# Patient Record
Sex: Female | Born: 1975 | Race: Black or African American | Hispanic: No | Marital: Single | Smoking: Former smoker
Health system: Southern US, Community
[De-identification: ages and names within clinical notes are randomized; demographics above are authoritative.]

## PROBLEM LIST (undated history)

## (undated) DIAGNOSIS — E0590 Thyrotoxicosis, unspecified without thyrotoxic crisis or storm: Secondary | ICD-10-CM

## (undated) HISTORY — PX: THYROIDECTOMY: SHX17

## (undated) HISTORY — PX: NO PAST SURGERIES: SHX2092

---

## 1997-09-04 ENCOUNTER — Inpatient Hospital Stay (HOSPITAL_COMMUNITY): Admission: AD | Admit: 1997-09-04 | Discharge: 1997-09-06 | Payer: Self-pay | Admitting: *Deleted

## 1997-11-17 ENCOUNTER — Other Ambulatory Visit: Admission: RE | Admit: 1997-11-17 | Discharge: 1997-11-17 | Payer: Self-pay

## 1997-11-17 ENCOUNTER — Encounter: Admission: RE | Admit: 1997-11-17 | Discharge: 1997-11-17 | Payer: Self-pay | Admitting: Family Medicine

## 1997-11-24 ENCOUNTER — Encounter: Admission: RE | Admit: 1997-11-24 | Discharge: 1997-11-24 | Payer: Self-pay | Admitting: Family Medicine

## 2001-12-02 ENCOUNTER — Other Ambulatory Visit: Admission: RE | Admit: 2001-12-02 | Discharge: 2001-12-02 | Payer: Self-pay | Admitting: *Deleted

## 2003-01-12 ENCOUNTER — Other Ambulatory Visit: Admission: RE | Admit: 2003-01-12 | Discharge: 2003-01-12 | Payer: Self-pay | Admitting: Gynecology

## 2004-05-31 ENCOUNTER — Inpatient Hospital Stay (HOSPITAL_COMMUNITY): Admission: AD | Admit: 2004-05-31 | Discharge: 2004-05-31 | Payer: Self-pay | Admitting: *Deleted

## 2004-07-05 ENCOUNTER — Inpatient Hospital Stay (HOSPITAL_COMMUNITY): Admission: AD | Admit: 2004-07-05 | Discharge: 2004-07-05 | Payer: Self-pay | Admitting: Obstetrics and Gynecology

## 2004-11-20 ENCOUNTER — Ambulatory Visit: Payer: Self-pay | Admitting: Endocrinology

## 2004-12-12 ENCOUNTER — Ambulatory Visit: Payer: Self-pay | Admitting: Endocrinology

## 2005-01-03 ENCOUNTER — Inpatient Hospital Stay (HOSPITAL_COMMUNITY): Admission: AD | Admit: 2005-01-03 | Discharge: 2005-01-06 | Payer: Self-pay | Admitting: Obstetrics and Gynecology

## 2005-02-11 ENCOUNTER — Ambulatory Visit: Payer: Self-pay | Admitting: Endocrinology

## 2005-05-15 ENCOUNTER — Ambulatory Visit: Payer: Self-pay | Admitting: Endocrinology

## 2007-01-26 ENCOUNTER — Encounter: Payer: Self-pay | Admitting: *Deleted

## 2007-01-26 DIAGNOSIS — E0590 Thyrotoxicosis, unspecified without thyrotoxic crisis or storm: Secondary | ICD-10-CM | POA: Insufficient documentation

## 2008-09-19 ENCOUNTER — Telehealth (INDEPENDENT_AMBULATORY_CARE_PROVIDER_SITE_OTHER): Payer: Self-pay | Admitting: *Deleted

## 2008-09-22 ENCOUNTER — Ambulatory Visit: Payer: Self-pay | Admitting: Endocrinology

## 2008-09-27 ENCOUNTER — Encounter (INDEPENDENT_AMBULATORY_CARE_PROVIDER_SITE_OTHER): Payer: Self-pay | Admitting: *Deleted

## 2008-10-12 ENCOUNTER — Encounter (HOSPITAL_COMMUNITY): Admission: RE | Admit: 2008-10-12 | Discharge: 2009-01-10 | Payer: Self-pay | Admitting: Endocrinology

## 2008-11-03 ENCOUNTER — Ambulatory Visit (HOSPITAL_COMMUNITY): Admission: RE | Admit: 2008-11-03 | Discharge: 2008-11-03 | Payer: Self-pay | Admitting: Endocrinology

## 2010-08-05 LAB — HCG, SERUM, QUALITATIVE: Preg, Serum: NEGATIVE

## 2010-09-14 NOTE — Discharge Summary (Signed)
Mikayla Baker, Mikayla [name]:  [id number]   MEDICAL RECORD NO.:  [id number]          PATIENT TYPE:  INP   LOCATION:  9112                          FACILITY:  [facility]   PHYSICIAN:  Huel Cote, M.D. DATE OF BIRTH:  03/12/1976   DATE OF ADMISSION:  01/03/2005  DATE OF DISCHARGE:  01/06/2005                                 DISCHARGE SUMMARY   DISCHARGE DIAGNOSES:  1.  Term pregnancy at 39 plus weeks delivered  2.  Status post precipitous vaginal delivery  3.  Hyperthyroidism.   DISCHARGE MEDICATIONS:  The patient declined any pain medicines and will be  seeing Dr. Rennis Harding to determine the dosing of her PTU.   HOSPITAL COURSE:  The patient is a 35 year old gravida 2, para 1-0-0-1 at 35  plus weeks who was admitted in labor with cervical exam of 6 cm. Prenatal  care had been complicated by hyperthyroidism which was treated in PTU and  she is seen by Dr. Everardo All. Otherwise her prenatal care was uneventful.   PRENATAL LABS:  O+, antibody negative, RPR nonreactive, rubella immune,  hepatitis B surface antigen negative, HIV negative, GC negative, Chlamydia  negative, triple screen normal, 1-hour Glucola 114 and group B strep  negative.   PAST OBSTETRICAL HISTORY:  Significant for vaginal delivery  in 1999 of 7  pounds 12 ounces infant.   PAST MEDICAL HISTORY:  Hyperthyroidism.   PAST SURGICAL HISTORY:  None.   ALLERGIES:  No known drug allergies.   MEDICATIONS ON ADMISSION:  She is on PTU 100 milligrams p.o. twice daily.   HOSPITAL COURSE:  She is afebrile with stable vital signs. She had overall  reassuring fetal heart rate pattern on admission. Shortly after admission  she had rupture of membranes with moderate meconium noted and rapidly  progressed to complete dilation. She then pushed well and had a vaginal  delivery of a viable female infant, Apgars were 1 and 8. His weight was 6  pounds 1 ounce. Nuchal cord x2 which was reduced. Infant was bulb and DeLee  suctioned on perineum with moderate meconium noted. NICU team was present.  The baby did require some resuscitation and the cord pH was 7.19. Placenta  delivered spontaneously and intact. Small second-degree laceration was  repaired with 3-0 Vicryl with a local block. Estimated blood loss was less  than 500 mL. The baby did require early admission to the newborn nursery,  however, did quite well thereafter. She was admitted for routine postpartum  care. By postpartum day #2 she was doing very well. She is afebrile with  stable vital signs. Discharge hemoglobin was 11.4. Fundus was firm. Her  bleeding was normal. The baby was doing quite well and nursing well and was  able to be discharged home with mom.      Huel Cote, M.D.  Electronically Signed     KR/MEDQ  D:  01/06/2005  T:  01/06/2005  Job:  [id number]

## 2011-03-25 ENCOUNTER — Encounter (HOSPITAL_COMMUNITY): Payer: Self-pay | Admitting: *Deleted

## 2011-03-25 ENCOUNTER — Inpatient Hospital Stay (HOSPITAL_COMMUNITY)
Admission: AD | Admit: 2011-03-25 | Discharge: 2011-03-25 | Disposition: A | Discharge status: Home / Self Care | Payer: Medicaid Other | Source: Ambulatory Visit | Attending: Obstetrics & Gynecology | Admitting: Obstetrics & Gynecology

## 2011-03-25 ENCOUNTER — Inpatient Hospital Stay (HOSPITAL_COMMUNITY): Payer: Medicaid Other

## 2011-03-25 DIAGNOSIS — O209 Hemorrhage in early pregnancy, unspecified: Secondary | ICD-10-CM

## 2011-03-25 HISTORY — DX: Thyrotoxicosis, unspecified without thyrotoxic crisis or storm: E05.90

## 2011-03-25 LAB — WET PREP, GENITAL: Yeast Wet Prep HPF POC: NONE SEEN

## 2011-03-25 LAB — HCG, QUANTITATIVE, PREGNANCY: hCG, Beta Chain, Quant, S: 48244 m[IU]/mL — ABNORMAL HIGH (ref <5)

## 2011-03-25 LAB — CBC
HCT: 32.1 % — ABNORMAL LOW (ref 36.0–46.0)
Platelets: 214 10*3/uL (ref 150–400)
RDW: 11.8 % (ref 11.5–15.5)
WBC: 6.3 10*3/uL (ref 4.0–10.5)

## 2011-03-25 LAB — ABO/RH: ABO/RH(D): O POS

## 2011-03-25 NOTE — Progress Notes (Signed)
SSE done per CNM.  Wet prep and cultures collected.  VE done.  

## 2011-03-25 NOTE — Progress Notes (Signed)
M. Williams, CNM at bedside.  Assessment done and poc discussed with pt.   

## 2011-03-25 NOTE — Progress Notes (Signed)
PT SAYS SHE WENT TO [facility]RGENT [facility] [place]- ON Friday- SAID PREG-  GAVE PREG VERI LETTER . DID NOT GIVE EDC.   PLANS TO START PNC WITH  [facility].    NOW SAYS AT 0530- WENT TO B-ROOM- WHEN SHE WIPED  - SAW  PINKISH BLOOD ON TOILET PAPER..    ON BED- SAME- BUT LIGHTER.  SAYS NO PAD ON NOW- DOES NOT FEEL ANY  IN  CLOTHES..  NO CRAMPING NOW.     LAST SEX-  1 MTH AGO.

## 2011-03-25 NOTE — ED Provider Notes (Signed)
History    Bleeding in pregnancy  HPI This is a 35 y.o. at 8 weeks by dates who presents with C/o vaginal bleeding today. No cramping. Has an appointment for NOB next week.  No fever, nausea or vomiting.  OB History    Grav Para Term Preterm Abortions TAB SAB Ect Mult Living   3 2 2       2       Past Medical History  Diagnosis Date  . Hyperthyroidism     Past Surgical History  Procedure Date  . No past surgeries     History reviewed. No pertinent family history.  History  Substance Use Topics  . Smoking status: Never Smoker   . Smokeless tobacco: Not on file  . Alcohol Use: No    Allergies: No Known Allergies  No prescriptions prior to admission    ROS As above  Physical Exam   Blood pressure 119/69, pulse 91, temperature 98.8 F (37.1 C), temperature source Oral, resp. rate 20, height 5\' 8"  (1.727 m), weight 138 lb 6 oz (62.766 kg), last menstrual period 12/24/2010, unknown if currently breastfeeding.  Physical Exam  No apparent distress. Resp: Unlabored Abdomen soft and nontender Pelvic:  Vagina has small to moderate dark red blood in vault. No clots, No active bleeding from os              Cervix long and closed              Uterus 10+ weeks size             Adnexae nontender  [place]:  SIUP at 13,.0 weeks        FHR 167        No SCH or evidence of bleeding        Placenta Anterior, above os  MAU Course  Procedures  Assessment and Plan  A:  Viable SIUP      Bleeding in first trimester P:  D/C home      SAB precautions      Keep appt for NOB  [facility] 03/25/2011, 8:12 AM

## 2011-03-26 LAB — GC/CHLAMYDIA PROBE AMP, GENITAL
Chlamydia, DNA Probe: NEGATIVE
GC Probe Amp, Genital: NEGATIVE

## 2011-04-10 LAB — OB RESULTS CONSOLE ANTIBODY SCREEN: Antibody Screen: NEGATIVE

## 2011-04-10 LAB — OB RESULTS CONSOLE GC/CHLAMYDIA: Chlamydia: NEGATIVE

## 2011-04-10 LAB — OB RESULTS CONSOLE ABO/RH: RH Type: POSITIVE

## 2011-04-10 LAB — OB RESULTS CONSOLE HEPATITIS B SURFACE ANTIGEN: Hepatitis B Surface Ag: NEGATIVE

## 2011-04-30 NOTE — L&D Delivery Note (Signed)
Operative Delivery Note Pt had SROM with bloody fluid, FHT then had repetetive variable decels and she rapidly progressed to completely dilated.  She pushed well and brought the vtx to +3, but the FHT was staying down in the 90s, so I elected to proceed with vacuum extraction.  At 11:13 PM a viable female was delivered via Vaginal, Vacuum Investment banker, operational).  Presentation: vertex; Position: Occiput,, Posterior; Station: +3.  Verbal consent: obtained from patient.  Risks and benefits discussed in detail.  Risks include, but are not limited to the risks of anesthesia, bleeding, infection, damage to maternal tissues, fetal cephalhematoma.  There is also the risk of inability to effect vaginal delivery of the head, or shoulder dystocia that cannot be resolved by established maneuvers, leading to the need for emergency cesarean section.  APGARs and weight pending.   Placenta status: spontaneous, intact.   Cord:  with the following complications: None.  Anesthesia: Epidural  Instruments: mushroom cup Episiotomy: None Lacerations: 1st degree;Perineal Suture Repair: 3.0 chromic Est. Blood Loss (mL): 350  Mom to postpartum.  Baby to stay with mom.  Discussed circumcision procedure and risks, obtained consent. Caidynce Muzyka D 09/26/2011, 11:30 PM

## 2011-05-02 ENCOUNTER — Other Ambulatory Visit (HOSPITAL_COMMUNITY): Payer: Self-pay | Admitting: Obstetrics and Gynecology

## 2011-05-02 DIAGNOSIS — O358XX0 Maternal care for other (suspected) fetal abnormality and damage, not applicable or unspecified: Secondary | ICD-10-CM

## 2011-05-02 DIAGNOSIS — IMO0002 Reserved for concepts with insufficient information to code with codable children: Secondary | ICD-10-CM

## 2011-05-02 DIAGNOSIS — Z3689 Encounter for other specified antenatal screening: Secondary | ICD-10-CM

## 2011-05-06 ENCOUNTER — Encounter: Payer: Self-pay | Admitting: Endocrinology

## 2011-05-06 ENCOUNTER — Ambulatory Visit (INDEPENDENT_AMBULATORY_CARE_PROVIDER_SITE_OTHER): Payer: Medicaid Other | Admitting: Endocrinology

## 2011-05-06 ENCOUNTER — Other Ambulatory Visit (INDEPENDENT_AMBULATORY_CARE_PROVIDER_SITE_OTHER): Payer: Medicaid Other

## 2011-05-06 VITALS — BP 102/60 | HR 78 | Temp 98.6°F | Ht 69.0 in | Wt 140.4 lb

## 2011-05-06 DIAGNOSIS — E0590 Thyrotoxicosis, unspecified without thyrotoxic crisis or storm: Secondary | ICD-10-CM

## 2011-05-06 NOTE — Progress Notes (Signed)
  Subjective:    Patient ID: Mikayla Baker, female    DOB: 1975-06-11, 36 y.o.   MRN: [id number]  HPI pt was last seen here for hyperthyroidism during her 2006 pregnancy.   She had i-131 rx in 2010.  She has not ret for f/u until now.  She is now at [redacted] weeks gestation.   Past Medical History  Diagnosis Date  . Hyperthyroidism     Past Surgical History  Procedure Date  . No past surgeries     History   Social History  . Marital Status: Single    Spouse Name: N/A    Number of Children: N/A  . Years of Education: N/A   Occupational History  . Not on file.   Social History Main Topics  . Smoking status: Never Smoker   . Smokeless tobacco: Not on file  . Alcohol Use: No  . Drug Use: No  . Sexually Active:    Other Topics Concern  . Not on file   Social History Narrative  . No narrative on file    No current outpatient prescriptions on file prior to visit.    No Known Allergies  No family history on file.  BP 102/60  Pulse 78  Temp(Src) 98.6 F (37 C) (Oral)  Ht 5\' 9"  (1.753 m)  Wt 140 lb 6.4 oz (63.685 kg)  BMI 20.73 kg/m2  SpO2 98%  LMP 12/24/2010    Review of Systems Denies weight change    Objective:   Physical Exam VITAL SIGNS:  See vs page GENERAL: no distress head: no deformity eyes: no periorbital swelling, but there is slight bilat proptosis external nose and ears are normal mouth: no lesion seen Neck: a healed scar is present.  i do not appreciate a nodule in the thyroid or elsewhere in the neck     outside test results are reviewed: TSH=0.012 Lab Results  Component Value Date   TSH 0.44 05/06/2011      Assessment & Plan:  Hyperthyroidism, persistent after i-131 rx, but resolved now, perhaps due to pregnancy

## 2011-05-06 NOTE — Patient Instructions (Addendum)
blood tests are being requested for you today.  please call [phone] to hear your test results.  You will be prompted to enter the 9-digit "MRN" number that appears at the top left of this page, followed by #.  Then you will hear the message. Please come back for a follow-up appointment for 1 month.  Please make an appointment. (update: i left message on phone-tree:  No rx needed now.  Please come back for a follow-up appointment for 1 month.  Please make an appointment).

## 2011-05-08 ENCOUNTER — Other Ambulatory Visit: Payer: Self-pay | Admitting: Maternal and Fetal Medicine

## 2011-05-08 ENCOUNTER — Ambulatory Visit (HOSPITAL_COMMUNITY)
Admission: RE | Admit: 2011-05-08 | Discharge: 2011-05-08 | Disposition: A | Discharge status: Home / Self Care | Payer: Medicaid Other | Source: Ambulatory Visit | Attending: Obstetrics and Gynecology | Admitting: Obstetrics and Gynecology

## 2011-05-08 ENCOUNTER — Encounter (HOSPITAL_COMMUNITY): Payer: Self-pay

## 2011-05-08 DIAGNOSIS — Z1389 Encounter for screening for other disorder: Secondary | ICD-10-CM | POA: Insufficient documentation

## 2011-05-08 DIAGNOSIS — IMO0002 Reserved for concepts with insufficient information to code with codable children: Secondary | ICD-10-CM

## 2011-05-08 DIAGNOSIS — O358XX0 Maternal care for other (suspected) fetal abnormality and damage, not applicable or unspecified: Secondary | ICD-10-CM | POA: Insufficient documentation

## 2011-05-08 DIAGNOSIS — Z3689 Encounter for other specified antenatal screening: Secondary | ICD-10-CM

## 2011-05-08 DIAGNOSIS — Z363 Encounter for antenatal screening for malformations: Secondary | ICD-10-CM | POA: Insufficient documentation

## 2011-05-08 NOTE — Progress Notes (Signed)
Genetic Counseling  High-Risk Gestation Note  Appointment Date:  05/08/2011 Referred By: Mikayla Belling, MD Date of Birth:  1975-12-11 Partner:  Mikayla Baker   Pregnancy History: [id number] Estimated Date of Delivery: 09/30/11 Estimated Gestational Age: [redacted]w[redacted]d  I met with [name] and her partner, Mr. Mikayla Baker, for genetic counseling regarding abnormal ultrasound findings by outside ultrasound.  A detailed fetal ultrasound was performed today.  The report will be sent under separate cover.  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  The amount of amniotic fluid was wnl for gestational age.  No other anomalies were visualized today.    This couple was counseled that clubfoot/feet is a term that actually describes three distinct anomalies (talipes equinovarus, talipes calcaneovalgus, and metatarsus varus) and occurs in 1 in 1000 births.  The most common type of clubfeet, talipes equinovarus, is characterized by forefoot adduction with supination, heel varus, and ankle equines, which cannot be brought back to a neutral position.  They were counseled that clubfeet can be an isolated difference, occur as a feature of an underlying syndrome, or the result of neurological or impairment.  We discussed that the most likely mode of inheritance for nonsyndromic, isolated clubfoot/feet is multifactorial.  There is a known genetic component for multifactorial clubfoot, as demonstrated by twin studies; environmental factors also play a role, including infection, drugs, and intrauterine environment (oligohydramnios, fetal positioning).  While the majority of cases of clubfoot/feet are isolated and multifactorial in etiology, it can also be environmental or genetic in etiology.  Clubfoot/feet is a feature in more than 200 known genetic syndromes, including both chromosomal and single gene conditions involving both neurologic and muscular processes.   They were then  counseled regarding the finding of an EIF.  We discussed that an EIF represents a calcification in the ventricle of the fetal heart.  In patients who have a low risk for fetal aneuploidy, this finding is typically considered to be benign and not associated with an increased risk for fetal aneuploidy.  However, when other risk factors are present, including: AMA, abnormal maternal serum screen results, ultrasound observation of other soft markers/structural anomalies, this finding is associated with an increased risk for fetal aneuploidy, specifically Down syndrome.  Considering Mikayla Baker's Quad screen adjusted risk for fetal Down syndrome (1 in 373) and the ultrasound findings, we discussed that the risk for aneuploidy is estimated to be 1-2%.    We discussed the option of amniocentesis, including the limitations, benefits, and risks.  They understand that amniocentesis allows for evaluation of the fetal chromosomes, but cannot detect all genetic conditions.  Specifically, we discussed that single gene conditions are difficult to diagnose prenatally unless a specific condition is suspected based on additional ultrasound findings or family history.  In addition, we discussed another type of screening test, noninvasive prenatal testing (NIPT), which utilizes cell free fetal DNA found in the maternal circulation. This test is not diagnostic for chromosome conditions, but can provide information regarding the presence or absence of extra fetal DNA for chromosomes 13, 18 and 21. Thus, it would not identify or rule out Turner syndrome. The reported detection rate is greater than 99% for Trisomy 21, greater than 97% for Trisomy 18, and is approximately 91% for Trisomy 13. The false positive rate is thought to be less than 1% for any of these conditions. After consideration of all the options, and a clear understanding of the newness and limitations of NIPT, they elected to proceed  with cell free fetal DNA testing. Those  results will be available in 8-10 days and will be forwarded to your office when we receive them. [name] will consider the option of amniocentesis pending the results of this screening test.   They were counseled that it is difficult to provide accurate anticipatory guidance or a discussion of postnatal management without clearly understanding the cause of the clubfeet.  However, they understand that regardless of the physiologic process that led to decreased movement in utero, it was the lack of fetal movement which resulted in the finding of clubfeet.  We discussed that postnatally, the contracted joint must be mobilized as much as possible during the first few months of life, as this is the time when the joint is most responsive.  In most cases, a combination of orthopedic procedures and physical therapy are used to correct the contracted joint.  They understand that a specific treatment plan will be determined after birth by an orthopedic specialist.  We discussed the option of meeting with a pediatric orthopedic specialist to discuss expectant management and treatment of clubfeet.  They would like to pursue treatment for their child at [facility]. This couple declined the option of a prenatal consult with pediatric orthopedics.  Both family histories were reviewed and found to be noncontributory for birth defects, mental retardation, and known genetic conditions.  Without further information regarding the provided family history, an accurate genetic risk cannot be calculated.   Further genetic counseling is warranted if more information is obtained.  The patient denied exposure to environmental toxins or chemical agents.  She denied the use of alcohol, tobacco or street drugs.  She denied significant viral illnesses during the course of her pregnancy.  Her medical and surgical history were noncontributory.   I counseled the patient for approximately 55 minutes regarding  the above risks and available options.    Mikayla Prose, MS Certified Genetic Counselor 05/08/11

## 2011-05-08 NOTE — Progress Notes (Signed)
Obstetric ultrasound performed today.  Please see report in [facility]. 

## 2011-05-20 ENCOUNTER — Other Ambulatory Visit: Payer: Self-pay

## 2011-06-05 ENCOUNTER — Ambulatory Visit (INDEPENDENT_AMBULATORY_CARE_PROVIDER_SITE_OTHER): Payer: Medicaid Other | Admitting: Endocrinology

## 2011-06-05 ENCOUNTER — Encounter: Payer: Self-pay | Admitting: Endocrinology

## 2011-06-05 ENCOUNTER — Other Ambulatory Visit (INDEPENDENT_AMBULATORY_CARE_PROVIDER_SITE_OTHER): Payer: Medicaid Other

## 2011-06-05 ENCOUNTER — Ambulatory Visit (HOSPITAL_COMMUNITY)
Admission: RE | Admit: 2011-06-05 | Discharge: 2011-06-05 | Disposition: A | Discharge status: Home / Self Care | Payer: Medicaid Other | Source: Ambulatory Visit | Attending: Obstetrics and Gynecology | Admitting: Obstetrics and Gynecology

## 2011-06-05 VITALS — BP 124/62 | HR 103 | Temp 98.4°F | Ht 69.0 in | Wt 144.0 lb

## 2011-06-05 DIAGNOSIS — O358XX0 Maternal care for other (suspected) fetal abnormality and damage, not applicable or unspecified: Secondary | ICD-10-CM

## 2011-06-05 DIAGNOSIS — E0590 Thyrotoxicosis, unspecified without thyrotoxic crisis or storm: Secondary | ICD-10-CM

## 2011-06-05 DIAGNOSIS — IMO0002 Reserved for concepts with insufficient information to code with codable children: Secondary | ICD-10-CM

## 2011-06-05 LAB — T4, FREE: Free T4: 0.72 ng/dL (ref 0.60–1.60)

## 2011-06-05 NOTE — Patient Instructions (Addendum)
blood tests are being requested for you today.  please call [phone] to hear your test results.  You will be prompted to enter the 9-digit "MRN" number that appears at the top left of this page, followed by #.  Then you will hear the message. Please come back for a follow-up appointment in 6 weeks (update: i left message on phone-tree:  rx as we discussed) 

## 2011-06-05 NOTE — Progress Notes (Signed)
  Subjective:    Patient ID: Mikayla Baker, female    DOB: 10-31-1975, 36 y.o.   MRN: [id number]  HPI pt was seen here for hyperthyroidism during her 2006 pregnancy.   She had i-131 rx for hyperthyroidism in 2010.  She is now at [redacted] weeks gestation.  She has gained only 4-5 lbs so far.   Past Medical History  Diagnosis Date  . Hyperthyroidism     Past Surgical History  Procedure Date  . No past surgeries     History   Social History  . Marital Status: Single    Spouse Name: N/A    Number of Children: N/A  . Years of Education: N/A   Occupational History  . Not on file.   Social History Main Topics  . Smoking status: Never Smoker   . Smokeless tobacco: Not on file  . Alcohol Use: No  . Drug Use: No  . Sexually Active:    Other Topics Concern  . Not on file   Social History Narrative  . No narrative on file    No current outpatient prescriptions on file prior to visit.    No Known Allergies  No family history on file.  BP 124/62  Pulse 103  Temp(Src) 98.4 F (36.9 C) (Oral)  Ht 5\' 9"  (1.753 m)  Wt 144 lb (65.318 kg)  BMI 21.27 kg/m2  SpO2 98%  LMP 12/24/2010  Review of Systems Denies fever.      Objective:   Physical Exam VITAL SIGNS:  See vs page.   GENERAL: no distress.   Neck:  The left thyroid lobe is slightly enlarged, with a nodular surface.  The right lobe is normal  Lab Results  Component Value Date   TSH 0.39 06/05/2011      Assessment & Plan:  H/o hyperthyroidism, now euthyroid off any meds.  Euthyroidism usually does not persist (patients develop hypo- or hyperthyroidism)

## 2011-07-03 ENCOUNTER — Ambulatory Visit (HOSPITAL_COMMUNITY)
Admission: RE | Admit: 2011-07-03 | Discharge: 2011-07-03 | Disposition: A | Discharge status: Home / Self Care | Payer: Medicaid Other | Source: Ambulatory Visit | Attending: Obstetrics and Gynecology | Admitting: Obstetrics and Gynecology

## 2011-07-03 DIAGNOSIS — IMO0002 Reserved for concepts with insufficient information to code with codable children: Secondary | ICD-10-CM

## 2011-07-03 DIAGNOSIS — O358XX0 Maternal care for other (suspected) fetal abnormality and damage, not applicable or unspecified: Secondary | ICD-10-CM

## 2011-07-03 DIAGNOSIS — Z3689 Encounter for other specified antenatal screening: Secondary | ICD-10-CM | POA: Insufficient documentation

## 2011-07-03 DIAGNOSIS — E0590 Thyrotoxicosis, unspecified without thyrotoxic crisis or storm: Secondary | ICD-10-CM

## 2011-07-17 ENCOUNTER — Ambulatory Visit: Payer: Medicaid Other | Admitting: Endocrinology

## 2011-07-17 DIAGNOSIS — Z0289 Encounter for other administrative examinations: Secondary | ICD-10-CM

## 2011-07-25 ENCOUNTER — Encounter: Payer: Self-pay | Admitting: Obstetrics and Gynecology

## 2011-08-12 ENCOUNTER — Other Ambulatory Visit (HOSPITAL_COMMUNITY): Payer: Self-pay | Admitting: Obstetrics and Gynecology

## 2011-08-12 DIAGNOSIS — O358XX0 Maternal care for other (suspected) fetal abnormality and damage, not applicable or unspecified: Secondary | ICD-10-CM

## 2011-08-14 ENCOUNTER — Ambulatory Visit (HOSPITAL_COMMUNITY)
Admission: RE | Admit: 2011-08-14 | Discharge: 2011-08-14 | Disposition: A | Discharge status: Home / Self Care | Payer: Medicaid Other | Source: Ambulatory Visit | Attending: Obstetrics and Gynecology | Admitting: Obstetrics and Gynecology

## 2011-08-14 DIAGNOSIS — O358XX0 Maternal care for other (suspected) fetal abnormality and damage, not applicable or unspecified: Secondary | ICD-10-CM | POA: Insufficient documentation

## 2011-08-14 DIAGNOSIS — Z3689 Encounter for other specified antenatal screening: Secondary | ICD-10-CM | POA: Insufficient documentation

## 2011-09-26 ENCOUNTER — Inpatient Hospital Stay (HOSPITAL_COMMUNITY)
Admission: AD | Admit: 2011-09-26 | Discharge: 2011-09-28 | DRG: 373 | Disposition: A | Discharge status: Home / Self Care | Payer: BC Managed Care – PPO | Source: Ambulatory Visit | Attending: Obstetrics and Gynecology | Admitting: Obstetrics and Gynecology

## 2011-09-26 ENCOUNTER — Encounter (HOSPITAL_COMMUNITY): Payer: Self-pay | Admitting: *Deleted

## 2011-09-26 ENCOUNTER — Encounter (HOSPITAL_COMMUNITY): Payer: Self-pay | Admitting: Anesthesiology

## 2011-09-26 ENCOUNTER — Inpatient Hospital Stay (HOSPITAL_COMMUNITY): Payer: BC Managed Care – PPO | Admitting: Anesthesiology

## 2011-09-26 ENCOUNTER — Encounter (HOSPITAL_COMMUNITY): Payer: Self-pay

## 2011-09-26 ENCOUNTER — Telehealth (HOSPITAL_COMMUNITY): Payer: Self-pay | Admitting: *Deleted

## 2011-09-26 DIAGNOSIS — O99284 Endocrine, nutritional and metabolic diseases complicating childbirth: Secondary | ICD-10-CM | POA: Diagnosis present

## 2011-09-26 DIAGNOSIS — E0590 Thyrotoxicosis, unspecified without thyrotoxic crisis or storm: Secondary | ICD-10-CM | POA: Diagnosis present

## 2011-09-26 DIAGNOSIS — Z22330 Carrier of Group B streptococcus: Secondary | ICD-10-CM

## 2011-09-26 DIAGNOSIS — O99892 Other specified diseases and conditions complicating childbirth: Secondary | ICD-10-CM | POA: Diagnosis present

## 2011-09-26 DIAGNOSIS — O358XX0 Maternal care for other (suspected) fetal abnormality and damage, not applicable or unspecified: Secondary | ICD-10-CM | POA: Diagnosis present

## 2011-09-26 DIAGNOSIS — E079 Disorder of thyroid, unspecified: Secondary | ICD-10-CM | POA: Diagnosis present

## 2011-09-26 DIAGNOSIS — IMO0001 Reserved for inherently not codable concepts without codable children: Secondary | ICD-10-CM

## 2011-09-26 LAB — CBC
HCT: 32.3 % — ABNORMAL LOW (ref 36.0–46.0)
Hemoglobin: 10.9 g/dL — ABNORMAL LOW (ref 12.0–15.0)
MCH: 28.7 pg (ref 26.0–34.0)
MCV: 85 fL (ref 78.0–100.0)
Platelets: 272 10*3/uL (ref 150–400)
RBC: 3.8 MIL/uL — ABNORMAL LOW (ref 3.87–5.11)

## 2011-09-26 MED ORDER — OXYCODONE-ACETAMINOPHEN 5-325 MG PO TABS: 1.0000 | ORAL_TABLET | ORAL | Status: DC | PRN

## 2011-09-26 MED ORDER — EPHEDRINE 5 MG/ML INJ
10.0000 mg | INTRAVENOUS | Status: DC | PRN
  Filled 2011-09-26: qty 4

## 2011-09-26 MED ORDER — LIDOCAINE HCL (PF) 1 % IJ SOLN
30.0000 mL | INTRAMUSCULAR | Status: DC | PRN
  Filled 2011-09-26: qty 30

## 2011-09-26 MED ORDER — LACTATED RINGERS IV SOLN
INTRAVENOUS | Status: DC
  Administered 2011-09-26: 125 mL/h via INTRAVENOUS

## 2011-09-26 MED ORDER — CITRIC ACID-SODIUM CITRATE 334-500 MG/5ML PO SOLN: 30.0000 mL | ORAL | Status: DC | PRN

## 2011-09-26 MED ORDER — IBUPROFEN 600 MG PO TABS: 600.0000 mg | ORAL_TABLET | Freq: Four times a day (QID) | ORAL | Status: DC | PRN

## 2011-09-26 MED ORDER — OXYTOCIN 20 UNITS IN LACTATED RINGERS INFUSION - SIMPLE: 125.0000 mL/h | Freq: Once | INTRAVENOUS | Status: DC

## 2011-09-26 MED ORDER — ONDANSETRON HCL 4 MG/2ML IJ SOLN: 4.0000 mg | Freq: Four times a day (QID) | INTRAMUSCULAR | Status: DC | PRN

## 2011-09-26 MED ORDER — DIPHENHYDRAMINE HCL 50 MG/ML IJ SOLN: 12.5000 mg | INTRAMUSCULAR | Status: DC | PRN

## 2011-09-26 MED ORDER — OXYTOCIN BOLUS FROM INFUSION
500.0000 mL | Freq: Once | INTRAVENOUS | Status: DC
  Filled 2011-09-26: qty 500
  Filled 2011-09-26: qty 1000

## 2011-09-26 MED ORDER — PHENYLEPHRINE 40 MCG/ML (10ML) SYRINGE FOR IV PUSH (FOR BLOOD PRESSURE SUPPORT)
80.0000 ug | PREFILLED_SYRINGE | INTRAVENOUS | Status: DC | PRN
  Filled 2011-09-26: qty 5

## 2011-09-26 MED ORDER — LACTATED RINGERS IV SOLN: 500.0000 mL | Freq: Once | INTRAVENOUS | Status: DC

## 2011-09-26 MED ORDER — LACTATED RINGERS IV SOLN: 500.0000 mL | INTRAVENOUS | Status: DC | PRN

## 2011-09-26 MED ORDER — PHENYLEPHRINE 40 MCG/ML (10ML) SYRINGE FOR IV PUSH (FOR BLOOD PRESSURE SUPPORT): 80.0000 ug | PREFILLED_SYRINGE | INTRAVENOUS | Status: DC | PRN

## 2011-09-26 MED ORDER — LIDOCAINE HCL (PF) 1 % IJ SOLN
INTRAMUSCULAR | Status: DC | PRN
  Administered 2011-09-26 (×3): 4 mL

## 2011-09-26 MED ORDER — EPHEDRINE 5 MG/ML INJ: 10.0000 mg | INTRAVENOUS | Status: DC | PRN

## 2011-09-26 MED ORDER — FENTANYL 2.5 MCG/ML BUPIVACAINE 1/10 % EPIDURAL INFUSION (WH - ANES)
14.0000 mL/h | INTRAMUSCULAR | Status: DC
  Administered 2011-09-26: 14 mL/h via EPIDURAL
  Filled 2011-09-26: qty 60

## 2011-09-26 MED ORDER — SODIUM CHLORIDE 0.9 % IV SOLN
1.0000 g | Freq: Four times a day (QID) | INTRAVENOUS | Status: DC
  Filled 2011-09-26 (×3): qty 1000

## 2011-09-26 MED ORDER — SODIUM CHLORIDE 0.9 % IV SOLN
2.0000 g | Freq: Once | INTRAVENOUS | Status: AC
  Administered 2011-09-26: 2 g via INTRAVENOUS
  Filled 2011-09-26: qty 2000

## 2011-09-26 MED ORDER — ACETAMINOPHEN 325 MG PO TABS: 650.0000 mg | ORAL_TABLET | ORAL | Status: DC | PRN

## 2011-09-26 NOTE — Progress Notes (Signed)
Dr Jackelyn Knife notified of patient, tracing, ctx pattern and sve result. He will enter admission orders.

## 2011-09-26 NOTE — Anesthesia Procedure Notes (Signed)
Epidural Patient location during procedure: OB Start time: 09/26/2011 8:23 PM Reason for block: procedure for pain  Staffing Performed by: anesthesiologist   Preanesthetic Checklist Completed: patient identified, site marked, surgical consent, pre-op evaluation, timeout performed, IV checked, risks and benefits discussed and monitors and equipment checked  Epidural Patient position: sitting Prep: site prepped and draped and DuraPrep Patient monitoring: continuous pulse ox and blood pressure Approach: midline Injection technique: LOR air  Needle:  Needle type: Tuohy  Needle gauge: 17 G Needle length: 9 cm Needle insertion depth: 4 cm Catheter type: closed end flexible Catheter size: 19 Gauge Catheter at skin depth: 9 cm Test dose: negative  Assessment Events: blood not aspirated, injection not painful, no injection resistance, negative IV test and no paresthesia  Additional Notes Discussed risk of headache, infection, bleeding, nerve injury and failed or incomplete block.  Patient voices understanding and wishes to proceed.

## 2011-09-26 NOTE — Anesthesia Preprocedure Evaluation (Signed)
Anesthesia Evaluation  Patient identified by MRN, date of birth, ID band Patient awake    Reviewed: Allergy & Precautions, H&P , NPO status , Patient's Chart, lab work & pertinent test results, reviewed documented beta blocker date and time   History of Anesthesia Complications Negative for: history of anesthetic complications  Airway Mallampati: III TM Distance: >3 FB Neck ROM: full    Dental  (+) Teeth Intact   Pulmonary neg pulmonary ROS,  breath sounds clear to auscultation        Cardiovascular negative cardio ROS  Rhythm:regular Rate:Normal     Neuro/Psych negative neurological ROS  negative psych ROS   GI/Hepatic negative GI ROS, Neg liver ROS,   Endo/Other  Hyperthyroidism (now s/p thyroidectomy)   Renal/GU negative Renal ROS     Musculoskeletal   Abdominal   Peds  Hematology negative hematology ROS (+)   Anesthesia Other Findings   Reproductive/Obstetrics (+) Pregnancy                           Anesthesia Physical Anesthesia Plan  ASA: II  Anesthesia Plan: Epidural   Post-op Pain Management:    Induction:   Airway Management Planned:   Additional Equipment:   Intra-op Plan:   Post-operative Plan:   Informed Consent: I have reviewed the patients History and Physical, chart, labs and discussed the procedure including the risks, benefits and alternatives for the proposed anesthesia with the patient or authorized representative who has indicated his/her understanding and acceptance.     Plan Discussed with:   Anesthesia Plan Comments:         Anesthesia Quick Evaluation

## 2011-09-26 NOTE — H&P (Signed)
Mikayla Baker is a 36 y.o. female presenting for evaluation of regular ctx.  She was scheduled for induction tomorrow am.  On evaluation in MAU, VE 5-6 cm dilated.  She has been admitted and received an epidural.  Prenatal care complicated by bilateral club feet and EIF on u/s, nl fetal ECHO.  Followed by Dr. Everardo All for hyperthyroidism.  See prenatal records for complete history.  Maternal Medical History:  Reason for admission: Reason for admission: contractions.  Contractions: Frequency: regular.   Perceived severity is strong.    Fetal activity: Perceived fetal activity is normal.      OB History    Grav Para Term Preterm Abortions TAB SAB Ect Mult Living   3 2 2  0 0 0 0 0 0 2    SVD at term x 2, 6 lbs 1 oz and 7 lbs 12 oz, no comps  Past Medical History  Diagnosis Date  . Hyperthyroidism    Past Surgical History  Procedure Date  . No past surgeries   . Thyroidectomy 2006   Family History: family history includes Diabetes in her maternal grandmother and Hypertension in her maternal grandmother. Social History:  reports that she has never smoked. She does not have any smokeless tobacco history on file. She reports that she does not drink alcohol or use illicit drugs.  Review of Systems  Respiratory: Negative.   Cardiovascular: Negative.     Dilation: 7 Effacement (%): 90 Station: 0 Exam by:: LCarpenter,RN Blood pressure 131/87, pulse 100, temperature 98.6 F (37 C), temperature source Oral, resp. rate 18, height 5\' 9"  (1.753 m), weight 72.576 kg (160 lb), last menstrual period 12/24/2010, SpO2 100.00%. Maternal Exam:  Uterine Assessment: Contraction strength is firm.  Contraction frequency is regular.   Abdomen: Patient reports no abdominal tenderness. Estimated fetal weight is 7 lbs.   Fetal presentation: vertex  Introitus: Normal vulva. Normal vagina.  Amniotic fluid character: not assessed.  Pelvis: adequate for delivery.   Cervix: Cervix evaluated by digital  exam.     Fetal Exam Fetal Monitor Review: Mode: ultrasound.   Baseline rate: 140.  Variability: moderate (6-25 bpm).   Pattern: accelerations present and no decelerations.    Fetal State Assessment: Category I - tracings are normal.     Physical Exam  Constitutional: She appears well-developed and well-nourished.  Cardiovascular: Normal rate, regular rhythm and normal heart sounds.   No murmur heard. Respiratory: Effort normal and breath sounds normal. No respiratory distress. She has no wheezes.  GI: Soft.       Gravid     Prenatal labs: ABO, Rh: O/Positive/-- (12/12 0000) Antibody: Negative (12/12 0000) Rubella: Immune (12/12 0000) RPR: Nonreactive (12/12 0000)  HBsAg: Negative (12/12 0000)  HIV: Non-reactive (12/12 0000)  GBS: positive  Assessment/Plan: IUP at 39 weeks in active labor.  GBS positive, has received Ampicillin.  She declines any intervention currently, wants to deliver after midnight.  Will monitor progress, anticipate SVD.   [name] D 09/26/2011, 10:14 PM

## 2011-09-26 NOTE — Telephone Encounter (Signed)
Preadmission screen  

## 2011-09-27 ENCOUNTER — Inpatient Hospital Stay (HOSPITAL_COMMUNITY): Admission: RE | Admit: 2011-09-27 | Payer: Medicaid Other | Source: Ambulatory Visit

## 2011-09-27 MED ORDER — BENZOCAINE-MENTHOL 20-0.5 % EX AERO
1.0000 "application " | INHALATION_SPRAY | CUTANEOUS | Status: DC | PRN
  Administered 2011-09-27: 1 via TOPICAL
  Filled 2011-09-27 (×2): qty 56

## 2011-09-27 MED ORDER — SIMETHICONE 80 MG PO CHEW: 80.0000 mg | CHEWABLE_TABLET | ORAL | Status: DC | PRN

## 2011-09-27 MED ORDER — SENNOSIDES-DOCUSATE SODIUM 8.6-50 MG PO TABS
2.0000 | ORAL_TABLET | Freq: Every day | ORAL | Status: DC
  Administered 2011-09-27: 2 via ORAL

## 2011-09-27 MED ORDER — MEASLES, MUMPS & RUBELLA VAC ~~LOC~~ INJ
0.5000 mL | INJECTION | Freq: Once | SUBCUTANEOUS | Status: DC
  Filled 2011-09-27: qty 0.5

## 2011-09-27 MED ORDER — METHYLERGONOVINE MALEATE 0.2 MG/ML IJ SOLN: 0.2000 mg | INTRAMUSCULAR | Status: DC | PRN

## 2011-09-27 MED ORDER — WITCH HAZEL-GLYCERIN EX PADS: 1.0000 "application " | MEDICATED_PAD | CUTANEOUS | Status: DC | PRN

## 2011-09-27 MED ORDER — MAGNESIUM HYDROXIDE 400 MG/5ML PO SUSP: 30.0000 mL | ORAL | Status: DC | PRN

## 2011-09-27 MED ORDER — LANOLIN HYDROUS EX OINT: TOPICAL_OINTMENT | CUTANEOUS | Status: DC | PRN

## 2011-09-27 MED ORDER — ZOLPIDEM TARTRATE 5 MG PO TABS: 5.0000 mg | ORAL_TABLET | Freq: Every evening | ORAL | Status: DC | PRN

## 2011-09-27 MED ORDER — OXYCODONE-ACETAMINOPHEN 5-325 MG PO TABS: 1.0000 | ORAL_TABLET | ORAL | Status: DC | PRN

## 2011-09-27 MED ORDER — TETANUS-DIPHTH-ACELL PERTUSSIS 5-2.5-18.5 LF-MCG/0.5 IM SUSP
0.5000 mL | Freq: Once | INTRAMUSCULAR | Status: DC
  Filled 2011-09-27: qty 0.5

## 2011-09-27 MED ORDER — DIBUCAINE 1 % RE OINT
1.0000 "application " | TOPICAL_OINTMENT | RECTAL | Status: DC | PRN
  Administered 2011-09-27: 1 via RECTAL
  Filled 2011-09-27 (×2): qty 28

## 2011-09-27 MED ORDER — METHYLERGONOVINE MALEATE 0.2 MG PO TABS: 0.2000 mg | ORAL_TABLET | ORAL | Status: DC | PRN

## 2011-09-27 MED ORDER — DIPHENHYDRAMINE HCL 25 MG PO CAPS: 25.0000 mg | ORAL_CAPSULE | Freq: Four times a day (QID) | ORAL | Status: DC | PRN

## 2011-09-27 MED ORDER — ONDANSETRON HCL 4 MG PO TABS: 4.0000 mg | ORAL_TABLET | ORAL | Status: DC | PRN

## 2011-09-27 MED ORDER — OXYTOCIN 20 UNITS IN LACTATED RINGERS INFUSION - SIMPLE: 125.0000 mL/h | INTRAVENOUS | Status: DC | PRN

## 2011-09-27 MED ORDER — IBUPROFEN 600 MG PO TABS
600.0000 mg | ORAL_TABLET | Freq: Four times a day (QID) | ORAL | Status: DC
  Administered 2011-09-27 – 2011-09-28 (×7): 600 mg via ORAL
  Filled 2011-09-27 (×7): qty 1

## 2011-09-27 MED ORDER — ONDANSETRON HCL 4 MG/2ML IJ SOLN: 4.0000 mg | INTRAMUSCULAR | Status: DC | PRN

## 2011-09-27 MED ORDER — PRENATAL MULTIVITAMIN CH
1.0000 | ORAL_TABLET | Freq: Every day | ORAL | Status: DC
  Administered 2011-09-27 – 2011-09-28 (×2): 1 via ORAL
  Filled 2011-09-27 (×2): qty 1

## 2011-09-27 NOTE — Progress Notes (Signed)
UR Chart review completed.  

## 2011-09-28 MED ORDER — IBUPROFEN 600 MG PO TABS: 600.0000 mg | ORAL_TABLET | Freq: Four times a day (QID) | ORAL | Status: AC | PRN

## 2011-09-28 NOTE — Discharge Instructions (Signed)
booklet °

## 2011-09-28 NOTE — Discharge Summary (Signed)
NAMEJANIRA, [name]:  [id number]  MEDICAL RECORD NO.:  [id number]  LOCATION:  9135                          FACILITY:  [facility]  PHYSICIAN:  Malachi Pro. Ambrose Mantle, M.D. DATE OF BIRTH:  December 31, 1975  DATE OF ADMISSION:  09/26/2011 DATE OF DISCHARGE:  09/28/2011                              DISCHARGE SUMMARY   This is a 36 year old black female who was scheduled for induction on Sep 27, 2011, but came to the hospital for evaluation of contractions and was found to be 5 to 6 cm dilated.  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 Everardo All for hyperthyroidism, blood group and type O positive, negative antibody, rubella immune, RPR nonreactive, hepatitis B surface antigen negative, HIV negative, group B strep positive.  The patient was admitted to the hospital and placed on ampicillin for group B strep prophylaxis.  She had spontaneous rupture of membranes with bloody fluid.  Fetal heart rate then had repetitive variable decelerations and she rapidly progressed to complete dilatation.  She pushed well and brought the vertex to a +3 station but the fetal heart rate stayed down in the 90s, so Dr. Jackelyn Knife elected to proceed with a vacuum extraction at 11:30 p.m., a living female infant was delivered vaginally by vacuum assistance.  Presentation was vertex.  Position occiput posterior.  The delivery was done from a +3 station.  Dr. Jackelyn Knife had discussed the risks and the patient agreed to proceed.  The Apgar and weight of the infant were pending.  The anesthesia was epidural.  The instruments were mushroom cup.  There was no episiotomy, a first-degree laceration on the perineum was sutured with 3-0 chromic.  Blood loss about 350 mL. Postpartum, the patient did very well and was discharged on the second postpartum day.  LABORATORY DATA:  Showed initial hemoglobin of 10.9, hematocrit 32.3, white count 9900,  platelet count 272,000.  RPR was nonreactive.  FINAL DIAGNOSES:  Intrauterine pregnancy at 39 weeks, delivered occiput posterior.  OPERATION:  Vacuum assisted vaginal delivery OP, repair of first-degree perineal laceration.  FINAL CONDITION:  Improved.  INSTRUCTIONS:  Regular discharge instruction booklet.  Also she will receive after visit summary, and she is given a prescription for Motrin 600 mg, 30 tablets, 1 every 6 hours as needed for pain.  She is to return in 6 weeks.     Malachi Pro. Ambrose Mantle, M.D.    TFH/MEDQ  D:  09/28/2011  T:  09/28/2011  Job:  [id number]

## 2011-09-28 NOTE — Progress Notes (Signed)
Patient ID: Mikayla Baker, female   DOB: 09/20/1975, 36 y.o.   MRN: [id number] #2 afebrile BP normal for d/c.

## 2012-11-09 ENCOUNTER — Ambulatory Visit: Payer: Self-pay | Admitting: Family Medicine

## 2012-11-09 VITALS — BP 118/70 | HR 105 | Temp 98.0°F | Resp 16 | Ht 69.0 in | Wt 127.0 lb

## 2012-11-09 DIAGNOSIS — Z0289 Encounter for other administrative examinations: Secondary | ICD-10-CM

## 2012-11-09 DIAGNOSIS — E0590 Thyrotoxicosis, unspecified without thyrotoxic crisis or storm: Secondary | ICD-10-CM

## 2012-11-09 NOTE — Progress Notes (Signed)
[address]   [place], [place]  [address]   [phone]  Subjective:    Patient ID: Mikayla Baker, female    DOB: Sep 15, 1975, 37 y.o.   MRN: [id number]  HPI This 37 y.o. 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   Review of Systems  Constitutional: Negative for fever, chills, diaphoresis, appetite change and fatigue.  HENT: Negative for ear pain, nosebleeds, sore throat, rhinorrhea, mouth sores, trouble swallowing, postnasal drip and tinnitus.   Eyes: Negative for photophobia, pain, redness and visual disturbance.  Respiratory: Negative for shortness of breath, wheezing and stridor.   Cardiovascular: Negative for chest pain, palpitations and leg swelling.  Gastrointestinal: Negative for nausea, vomiting, abdominal pain, diarrhea and constipation.  Endocrine: Negative for polydipsia, polyphagia and polyuria.  Genitourinary: Negative for frequency, hematuria, flank pain, enuresis and genital sores.  Musculoskeletal: Negative for myalgias, back pain, joint swelling, arthralgias and gait problem.  Skin: Negative for color change, pallor, rash and wound.  Neurological: Negative for dizziness, tremors, seizures, syncope, facial asymmetry, speech difficulty, light-headedness, numbness and headaches.  Hematological: Negative for adenopathy. Does not bruise/bleed easily.  Psychiatric/Behavioral: Negative for sleep disturbance, self-injury and dysphoric mood. The patient is not nervous/anxious.     Past Medical History  Diagnosis Date  . Hyperthyroidism     thyroid ablation partial; worsens with pregnancy.    Past Surgical History  Procedure Laterality Date  . No past surgeries    . Thyroidectomy  2006    Prior to Admission medications   Medication Sig Start Date End Date Taking? Authorizing Provider  Prenatal Vit-Fe Fumarate-FA (PRENATAL MULTIVITAMIN) TABS Take 1 tablet by mouth daily.   Yes Historical Provider, MD    No Known Allergies  History   Social History  .  Marital Status: Single    Spouse Name: N/A    Number of Children: N/A  . Years of Education: N/A   Occupational History  . Not on file.   Social History Main Topics  . Smoking status: Never Smoker   . Smokeless tobacco: Not on file  . Alcohol Use: No  . Drug Use: No  . Sexually Active: Not on file   Other Topics Concern  . 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      Tobacco:  None      Alcohol: beer on weekends; no DWIs.      Drugs: none      Exercise:  none    Family History  Problem Relation Age of Onset  . Diabetes Maternal Grandmother   . Hypertension Maternal Grandmother   . Cancer Mother 87    Breast cancer  . Cancer Father     lung cancer 55       Objective:   Physical Exam  Nursing note and vitals reviewed. Constitutional: She is oriented to person, place, and time. She appears well-developed and well-nourished. No distress.  HENT:  Head: Normocephalic and atraumatic.  Right Ear: External ear normal.  Left Ear: External ear normal.  Nose: Nose normal.  Mouth/Throat: Oropharynx is clear and moist.  Eyes: Conjunctivae and EOM are normal. Pupils are equal, round, and reactive to light.  Neck: Normal range of motion. Neck supple. No thyromegaly present.  Cardiovascular: Normal rate, regular rhythm  and normal heart sounds.  Exam reveals no gallop and no friction rub.   No murmur heard. Pulmonary/Chest: Effort normal and breath sounds normal.  Abdominal: Soft. Bowel sounds are normal.  Musculoskeletal:       Right shoulder: Normal.       Left shoulder: Normal.       Cervical back: Normal.       Thoracic back: Normal.       Lumbar back: Normal.  Lymphadenopathy:    She has no cervical adenopathy.  Neurological: She is alert and oriented to person, place, and time. She has normal reflexes. No cranial  nerve deficit. She exhibits normal muscle tone. Coordination normal.  Skin: Skin is warm and dry. She is not diaphoretic.  Psychiatric: She has a normal mood and affect. Her behavior is normal. Judgment and thought content normal.          Assessment & Plan:  Health examination of defined subpopulation  Hyperthyroidism complicating pregnancy, unspecified trimester   1.  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 2.  Hyperthyroidism complicating pregnancy: onset 2007; s/p endocrinology consultation; s/p ablation; no recent issues.  Asymptomatic.

## 2012-11-12 ENCOUNTER — Encounter: Payer: Self-pay | Admitting: Family Medicine

## 2014-01-10 IMAGING — US US OB FOLLOW-UP
1 series · 14 of 28 positions shown · non-contrast
Comparison: none

[Series 1: us ob follow-up · 0.09mm/px · 14 of 58 slices shown]
[im 3/58]
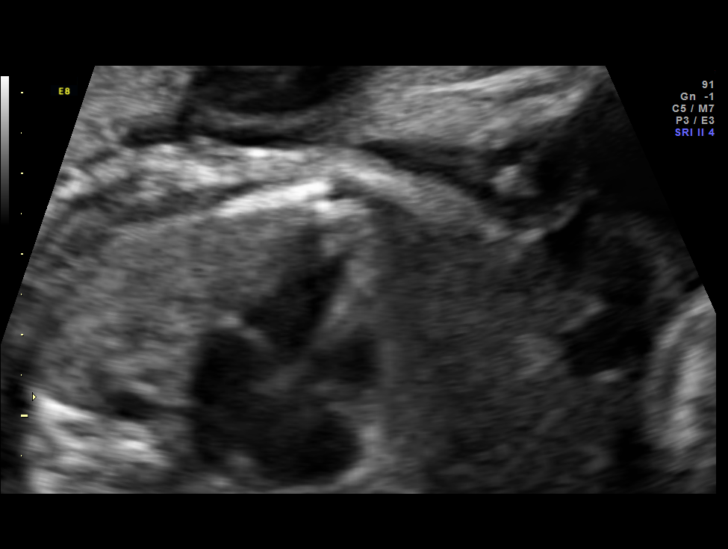
[im 7/58]
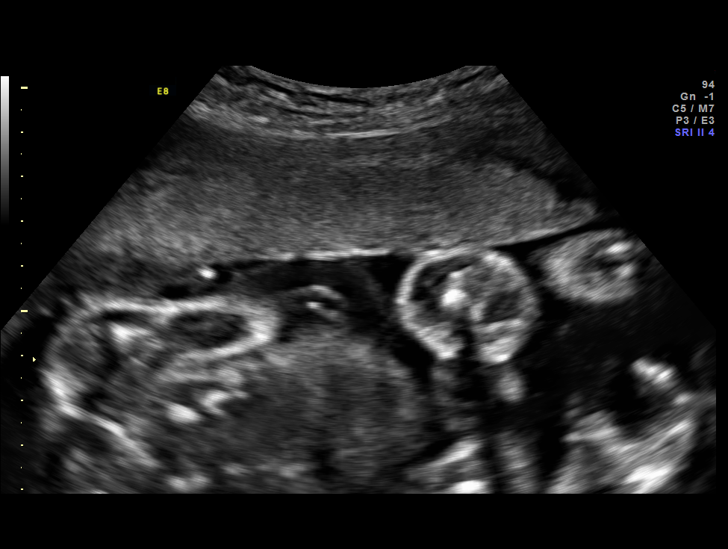
[im 11/58]
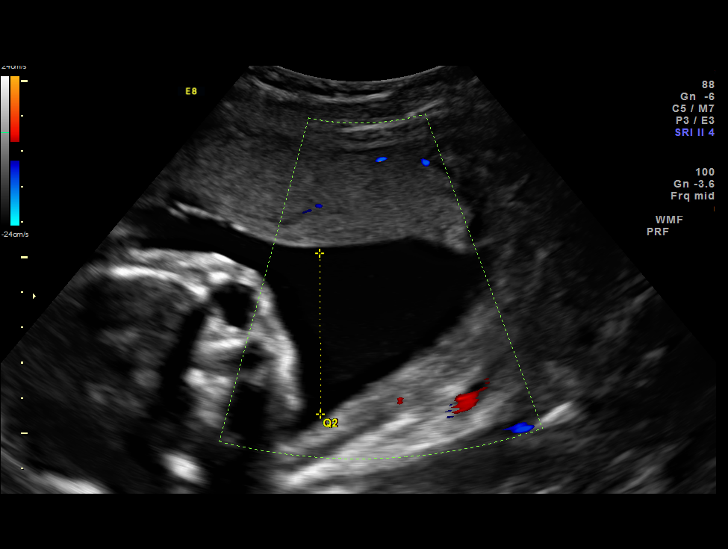
[im 15/58]
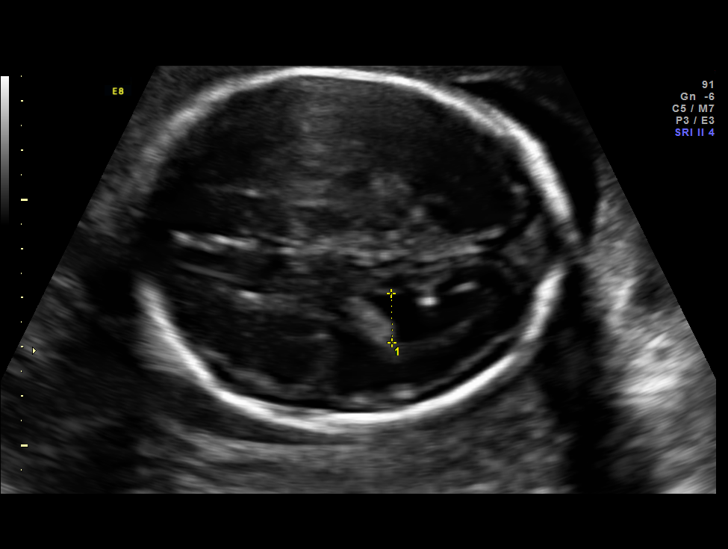
[im 20/58]
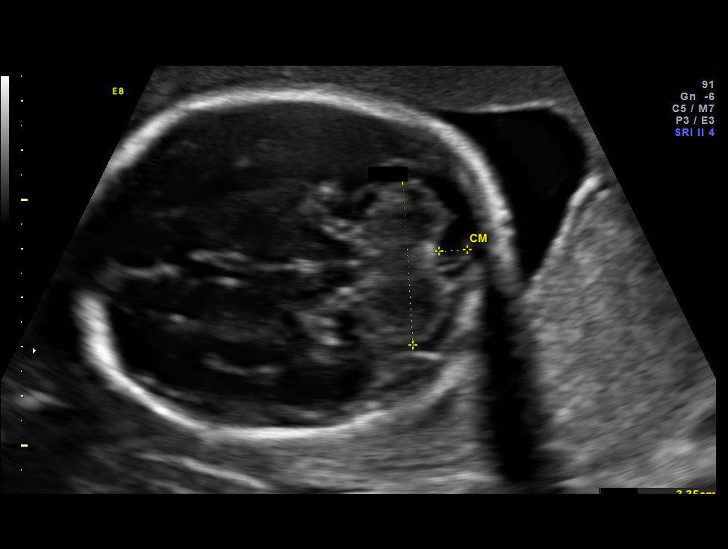
[im 24/58]
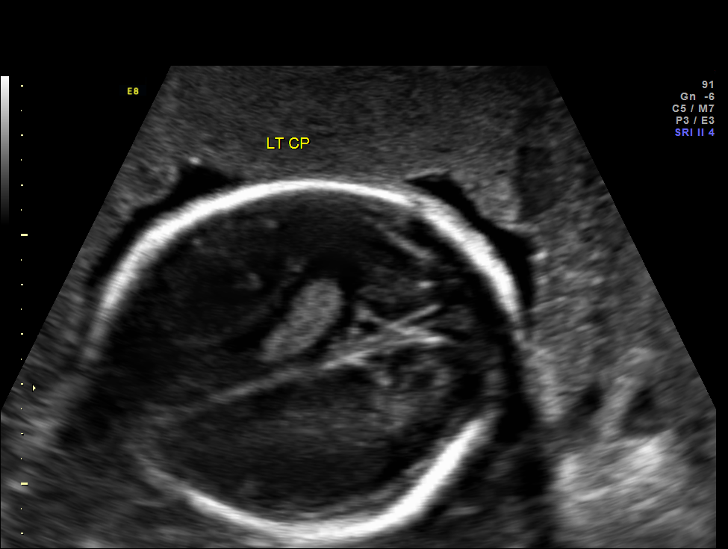
[im 28/58]
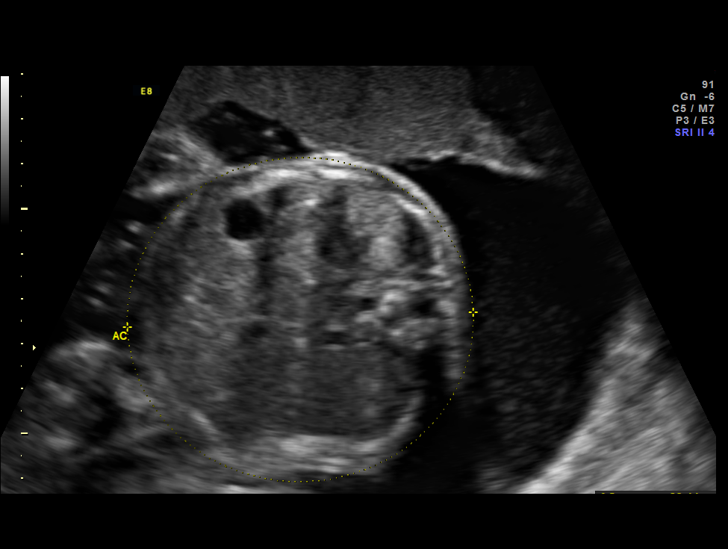
[im 32/58]
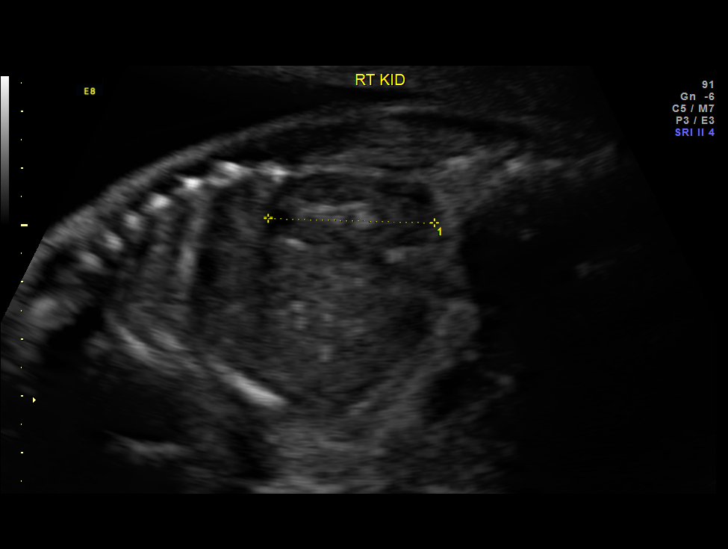
[im 36/58]
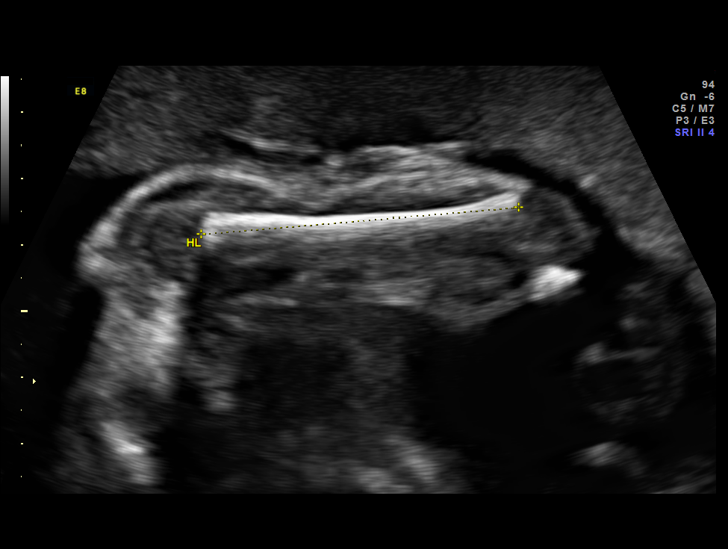
[im 41/58]
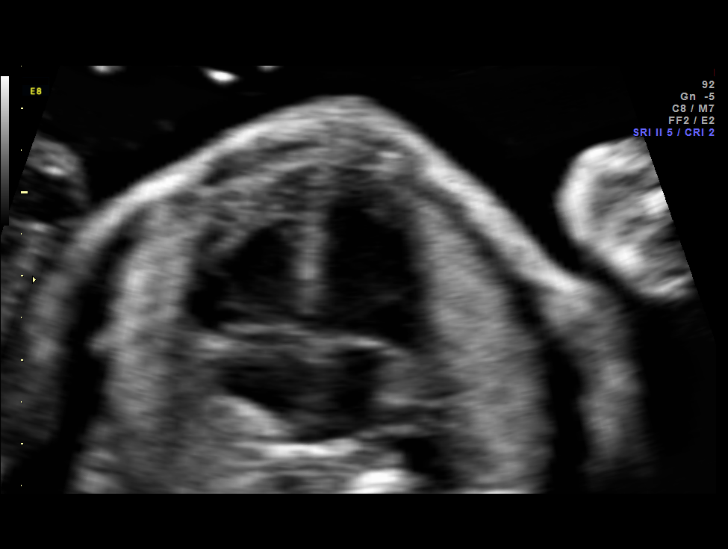
[im 45/58]
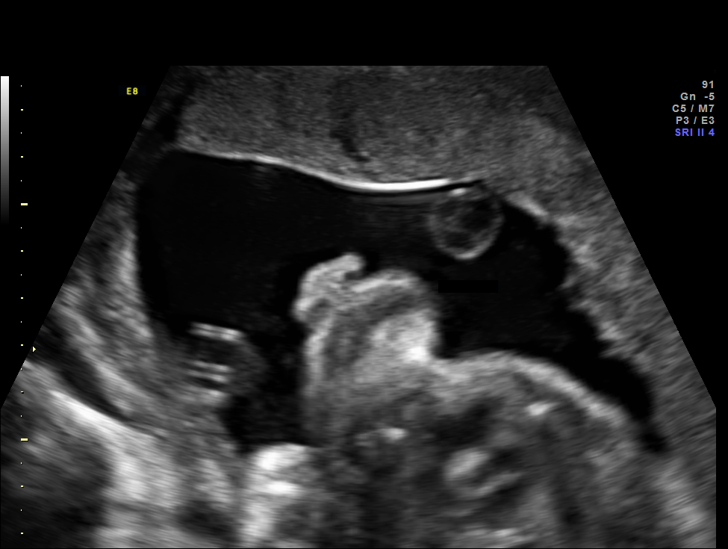
[im 49/58]
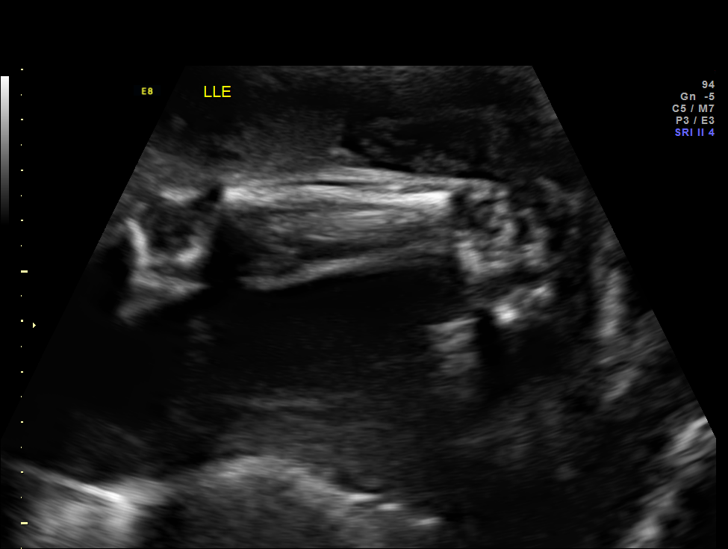
[im 53/58]
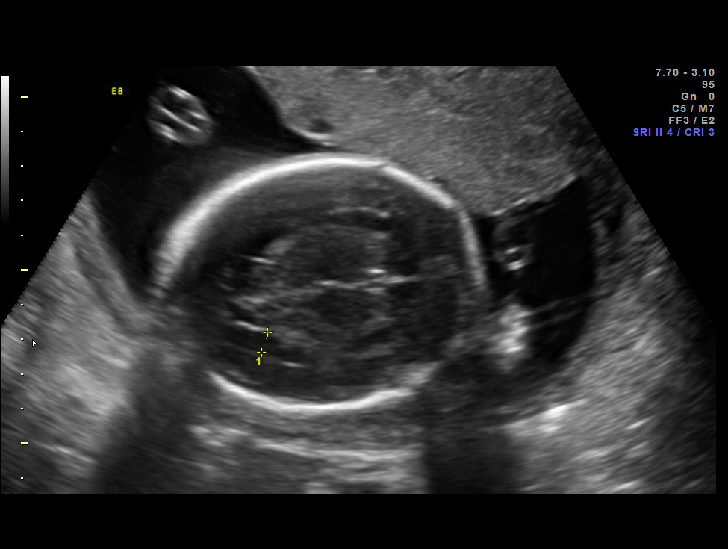
[im 58/58]
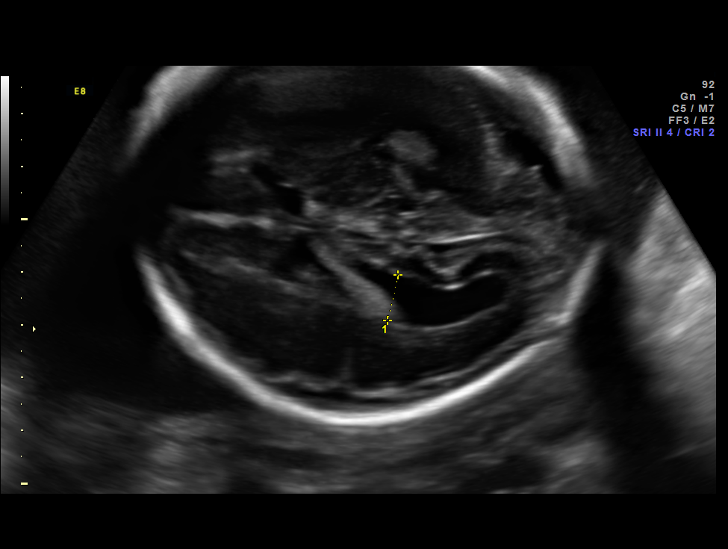

[14 of 28 positions shown; findings below may reference images not displayed]

Canned report from images found in remote index.

Refer to host system for actual result text.

## 2014-11-26 ENCOUNTER — Ambulatory Visit (INDEPENDENT_AMBULATORY_CARE_PROVIDER_SITE_OTHER): Payer: Self-pay | Admitting: Family Medicine

## 2014-11-26 VITALS — BP 108/60 | HR 100 | Temp 98.4°F | Resp 18 | Ht 69.5 in | Wt 133.0 lb

## 2014-11-26 DIAGNOSIS — Z0289 Encounter for other administrative examinations: Secondary | ICD-10-CM

## 2014-11-26 NOTE — Progress Notes (Signed)
Subjective:    Patient ID: Mikayla Baker, female    DOB: Oct 24, 1975, 39 y.o.   MRN: [id number]  11/26/2014  Employment Physical   HPI This 39 y.o. female presents for evaluation for DOT physical.  Last DOT 11/09/2012.    Denies HTN, DMII, hyperlipidemia, chronic pain syndrome, seizures, depression/anxiety.    Currently pregnant [redacted] weeks.  Review of Systems  Constitutional: Negative for fever, chills, diaphoresis, activity change, appetite change, fatigue and unexpected weight change.  HENT: Negative for congestion, dental problem, drooling, ear discharge, ear pain, facial swelling, hearing loss, mouth sores, nosebleeds, postnasal drip, rhinorrhea, sinus pressure, sneezing, sore throat, tinnitus, trouble swallowing and voice change.   Eyes: Negative for photophobia, pain, discharge, redness, itching and visual disturbance.  Respiratory: Negative for apnea, cough, choking, chest tightness, shortness of breath, wheezing and stridor.   Cardiovascular: Negative for chest pain, palpitations and leg swelling.  Gastrointestinal: Negative for nausea, vomiting, abdominal pain, diarrhea, constipation, blood in stool, abdominal distention, anal bleeding and rectal pain.  Endocrine: Negative for cold intolerance, heat intolerance, polydipsia, polyphagia and polyuria.  Genitourinary: Negative for dysuria, urgency, frequency, hematuria, flank pain, decreased urine volume, vaginal bleeding, vaginal discharge, enuresis, difficulty urinating, genital sores, vaginal pain, menstrual problem, pelvic pain and dyspareunia.  Musculoskeletal: Negative for myalgias, back pain, joint swelling, arthralgias, gait problem, neck pain and neck stiffness.  Skin: Negative for color change, pallor, rash and wound.  Allergic/Immunologic: Negative for environmental allergies, food allergies and immunocompromised state.  Neurological: Negative for dizziness, tremors, seizures, syncope, facial asymmetry, speech difficulty,  weakness, light-headedness, numbness and headaches.  Hematological: Negative for adenopathy. Does not bruise/bleed easily.  Psychiatric/Behavioral: Negative for suicidal ideas, hallucinations, behavioral problems, confusion, sleep disturbance, self-injury, dysphoric mood, decreased concentration and agitation. The patient is not nervous/anxious and is not hyperactive.     Past Medical History  Diagnosis Date  . Hyperthyroidism     thyroid ablation partial; worsens with pregnancy.   History reviewed. No pertinent past surgical history. No Known Allergies Current Outpatient Prescriptions  Medication Sig Dispense Refill  . Prenatal Vit-Fe Fumarate-FA (PRENATAL MULTIVITAMIN) TABS Take 1 tablet by mouth daily.     No current facility-administered medications for this visit.   History   Social History  . Marital Status: Single    Spouse Name: N/A  . Number of Children: N/A  . Years of Education: N/A   Occupational History  . Not on file.   Social History Main Topics  . Smoking status: Never Smoker   . Smokeless tobacco: Not on file  . Alcohol Use: No  . Drug Use: No  . Sexual Activity: Not on file   Other Topics Concern  . Not on file   Social History Narrative   Marital status: single      Children: 3 children (10, 107); 1 son died at age 32 from 56.        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      Tobacco:  None      Alcohol: beer on weekends; no DWIs.       Drugs: none      Exercise:  none   Family History  Problem Relation Age of Onset  . Diabetes Maternal Grandmother   . Hypertension Maternal Grandmother   . Cancer Mother 63    Breast cancer  . Cancer Father     lung cancer 83  Objective:    BP 108/60 mmHg  Pulse 100  Temp(Src) 98.4 F (36.9 C) (Oral)  Resp 18  Ht 5' 9.5" (1.765 m)  Wt 133 lb (60.328 kg)  BMI 19.37 kg/m2  SpO2 97% Physical Exam  Constitutional: She is oriented to  person, place, and time. She appears well-developed and well-nourished. No distress.  HENT:  Head: Normocephalic and atraumatic.  Right Ear: External ear normal.  Left Ear: External ear normal.  Nose: Nose normal.  Mouth/Throat: Oropharynx is clear and moist.  Eyes: Conjunctivae and EOM are normal. Pupils are equal, round, and reactive to light.  Neck: Normal range of motion and full passive range of motion without pain. Neck supple. No JVD present. Carotid bruit is not present. No thyromegaly present.  Cardiovascular: Normal rate, regular rhythm and normal heart sounds.  Exam reveals no gallop and no friction rub.   No murmur heard. Pulmonary/Chest: Effort normal and breath sounds normal. She has no wheezes. She has no rales.  Abdominal: Soft. Bowel sounds are normal. She exhibits no distension and no mass. There is no tenderness. There is no rebound and no guarding.  Musculoskeletal:       Right shoulder: Normal.       Left shoulder: Normal.       Cervical back: Normal.  Lymphadenopathy:    She has no cervical adenopathy.  Neurological: She is alert and oriented to person, place, and time. She has normal reflexes. No cranial nerve deficit. She exhibits normal muscle tone. Coordination normal.  Skin: Skin is warm and dry. No rash noted. She is not diaphoretic. No erythema. No pallor.  Psychiatric: She has a normal mood and affect. Her behavior is normal. Judgment and thought content normal.  Nursing note and vitals reviewed.       Assessment & Plan:   1. Physical examination of employee     1.  DOT physical:  2 year card provided. Normal vision, hearing, color vision, peripheral vision; normal u/a.     No orders of the defined types were placed in this encounter.    No Follow-up on file.     Aldea Avis Paulita Fujita, M.D. [facility] [address] [place], [place]  [address] [phone] phone [phone] fax

## 2014-12-05 LAB — OB RESULTS CONSOLE HIV ANTIBODY (ROUTINE TESTING): HIV: NONREACTIVE

## 2014-12-05 LAB — OB RESULTS CONSOLE RPR: RPR: NONREACTIVE

## 2014-12-05 LAB — OB RESULTS CONSOLE GC/CHLAMYDIA
CHLAMYDIA, DNA PROBE: NEGATIVE
GC PROBE AMP, GENITAL: NEGATIVE

## 2014-12-05 LAB — OB RESULTS CONSOLE ABO/RH: RH TYPE: POSITIVE

## 2014-12-05 LAB — OB RESULTS CONSOLE RUBELLA ANTIBODY, IGM: Rubella: IMMUNE

## 2014-12-05 LAB — OB RESULTS CONSOLE HEPATITIS B SURFACE ANTIGEN: HEP B S AG: NEGATIVE

## 2014-12-05 LAB — OB RESULTS CONSOLE ANTIBODY SCREEN: Antibody Screen: NEGATIVE

## 2015-04-30 NOTE — L&D Delivery Note (Signed)
Vaginal Delivery Note The pt utilized an epidural as pain management.   Spontaneous rupture of membranes today, at 1905, clear.  GBS negative  Cervical dilation was complete at 1920.  NICHD Category 2.    Pushing with guidance began at 1928.   After 18 minutes of pushing the head, shoulders and the body of a viable female infant "Denim" delivered spontaneously with maternal effort in the ROP position at 66.   Loose [place] x1 infant somersaulted thru without difficulty.   With vigorous tone and spontaneous cry, the infant was placed on moms abd. At approxomately 6 minutes of life the infant developed flaccid tone, dusky color and shallow breathing.  The cord was immediately clamped, cut and the infant was moved over to the warmer and a code APGAR was called.  NICU team arrived including Dr.Christie Davanzo. At approximately 15 minutes of life Dr Joana Reamer spoke with the mother explaining there's not clear reason for this episode.  She reassured her that the infant has recovered and the nursing staff will continue to monitor.   Cord gas and blood collection and given to the NICU team.   Spontaneous delivery of a intact placenta with a 3 vessel cord via Veatrice Kells at 1955.   Episiotomy: None   The vulva, perineum, vaginal vault, rectum and cervix were inspected and revealed a 1 degree perineal,  repaired using a 4-0 vicryl on a SH.  Lidocaine was not used, the epidural was sufficient for the repair.  Patient tolerated repair well.   Postpartum pitocin as ordered.  Fundus firm, lochia minimum, bleeding under control. EBL 50, Pt hemodynamically stable.   Sponge, laps and needle count correct and verified with the primary care nurse.  Attending MD available at all times.    Routine postpartum orders   Mother desires nexplanon for contraception  Mom plans to breastfeed  Infant to have in patient circumcision   Placenta to pathology: NO Cord Gases sent to lab: YES, 7.36 Cord blood sent to lab:  YES   APGARS:  8 at 1 minute, 2 at 5 minutes, and 9 at 10 minutes Weight:. 7lbs 12oz     Both mom and baby were left in stable condition, baby skin to skin.      Rocket Gunderson, CNM, MSN 06/28/2015. 8:22 PM

## 2015-05-30 LAB — OB RESULTS CONSOLE GBS: GBS: NEGATIVE

## 2015-06-21 ENCOUNTER — Inpatient Hospital Stay (HOSPITAL_COMMUNITY)
Admission: AD | Admit: 2015-06-21 | Payer: BC Managed Care – PPO | Source: Ambulatory Visit | Admitting: Obstetrics and Gynecology

## 2015-06-27 ENCOUNTER — Encounter (HOSPITAL_COMMUNITY): Payer: Self-pay | Admitting: *Deleted

## 2015-06-27 ENCOUNTER — Telehealth (HOSPITAL_COMMUNITY): Payer: Self-pay | Admitting: *Deleted

## 2015-06-27 DIAGNOSIS — Z8482 Family history of sudden infant death syndrome: Secondary | ICD-10-CM | POA: Insufficient documentation

## 2015-06-27 DIAGNOSIS — Z3A41 41 weeks gestation of pregnancy: Secondary | ICD-10-CM

## 2015-06-27 DIAGNOSIS — O09529 Supervision of elderly multigravida, unspecified trimester: Secondary | ICD-10-CM | POA: Insufficient documentation

## 2015-06-27 DIAGNOSIS — IMO0002 Reserved for concepts with insufficient information to code with codable children: Secondary | ICD-10-CM | POA: Insufficient documentation

## 2015-06-27 DIAGNOSIS — O480 Post-term pregnancy: Secondary | ICD-10-CM | POA: Diagnosis present

## 2015-06-27 NOTE — H&P (Signed)
Mikayla Baker is a 40 y.o. female, [phone] at 60 weeks, presenting for induction due to post term pregnancy.  She denies HA, visual sx, or epigastric pain, reports +FM.  Patient Active Problem List   Diagnosis Date Noted  . 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 AMA (advanced maternal age) multigravida 35+ Jul 10, 2015  . Post term pregnancy, 41 weeks 2015-07-10  . ASCUS with positive high risk HPV--colpo 12/2014 07/10/15  . FH: sudden infant death syndrome--2013 child July 10, 2015  . HYPERTHYROIDISM 01/26/2007  Hx partial thyroid ablation with I-131 2010--no recent or current meds. No hx HSV--only + titer noted during pregnancy  History of present pregnancy: Patient entered care at 11 5/7 weeks.   EDC of 06/21/15 was established by LMP and in agreement with [place] at 20 6/7 weeks   Anatomy scan:  20 6/7 weeks, with limited anatomy and an anterior placenta, cx 3.9, normal fluid, EFW 49%ile. Additional [place] evaluations:   23 weeks:  F/u anatomy completed, normal fluid, EFW 55%ile. 40 1/7 weeks:  BPP 8/8, AFI 14.23, 60%ile, vtx. 40 6/7 weeks:  BPP 8/8, AFI 11.30, 45%ile, vtx.  Significant prenatal events:  Normal Panorama and AFP.  HSV 2 + by titer--partner with HSV outbreak, patient has never had outbreak.  EK advised patient Valtrex was optional, patient elected to take Valtrex at 39 weeks.  Considering IUD.  Thyroid testing WNL during pregnancy. Last evaluation:  July 10, 2015--Cervix 1 cm, 70%, vtx, -3, BP WNL.  Scheduled for induction 06/28/15.  OB History    Gravida Para Term Preterm AB TAB SAB Ectopic Multiple Living   0 0 0 0 0 0 2    1999--SVB, 40 weeks, female, ? Length of labor, epidural, [facility] at [facility] oligohydramnios per patient (no records available) 2006--SVB, 40 weeks, rapid labor, 6 lbs, no anesthesia, delivered with Dr. Senaida Ores, oligo per patient (not reflected in records) 2013--VE assisted delivery due to FHR issues, 40 weeks, epidural, delivered with Dr. Meisinger--passed away at 18  months due to SIDS.  Bilateral club feet, oligo per patient (not reflected in records)   Past Medical History  Diagnosis Date  . Hyperthyroidism     thyroid ablation partial; worsens with pregnancy.  Followed in past by Dr. Hurshel Keys eval since 2013 Surgical Hx:  Hx partial thyroid ablation  Family History: family history includes Cancer in her father; Cancer (age of onset: 18) in her mother; Diabetes in her maternal grandmother; Hypertension in her maternal grandmother.  Breast cancer--Mother, MGM, MA x 3.  Social History:  reports that she has never smoked. She does not have any smokeless tobacco history on file. She reports that she does not drink alcohol or use illicit drugs.  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   Prenatal Transfer Tool  Maternal Diabetes: No Genetic Screening: Normal Panorama and AFP Maternal Ultrasounds/Referrals: Normal Fetal Ultrasounds or other Referrals:  None Maternal Substance Abuse:  No Significant Maternal Medications:  None Significant Maternal Lab Results: Lab values include: Group B Strep negative  TDAP NA Flu NA  ROS:  Occasional UCs, +FM  No Known Allergies  There were no vitals filed for this visit. Dilation: 2 Effacement (%): 70 Station: -2 Exam by:: Emilee Hero, V, CNM Last menstrual period 09/14/2014.  Chest clear Heart RRR without murmur Abd gravid, NT, FH 40 Pelvic: posterior, 2 cm, 70%, vtx, -2, cervix soft. No HSV lesions or prodrome  noted or reported. Ext: WNL  FHR: Category 1 UCs:  Occasional, mild  Prenatal labs: ABO, Rh: O/Positive/-- (08/08 0000) Antibody: Negative (08/08 0000) Rubella:  Immune RPR: Nonreactive (08/08 0000)  HBsAg: Negative (08/08 0000)  HIV: Non-reactive (08/08 0000)  GBS:  Negative 05/30/15 Sickle cell/Hgb electrophoresis:  AA Pap:  ASCUS with HRHPV  12/20/14--had colpo, ? F/u plan GC:  Negative 12/05/14 Chlamydia:  Negative 12/05/14 Genetic screenings:  Normal Panorama and AFP Glucola:  WNL Other:   Hgb 12.8 at NOB, 10.4 at 28 weeks Thyroid panel 12/05/14: TSH 0.011,  FTI 4.3 T3 uptake 25 T4 17.1  Thyroid testing 03/30/15: TSH 0.489 03/30/15 Free T3 2.7 Free T4 1.28 HSV 2 titer positive 03/30/15      Assessment/Plan: IUP at 41 weeks Post-term pregnancy Hx prior SIDS loss AMA--normal genetic testing HSV, no recent/current outbreaks ASCUS, +HRHPV 11/2014--plan repeat colpo pp or in 11/2015. Hyperthyroidism--s/p partial ablation 2010, no recent/current meds. GBS negative  Plan: Admit to Birthing Suite per consult with Dr. Normand Sloop Routine [facility] orders Pain med/epidural prn Plan pitocin for induction. Risks and benefits of induction were reviewed, including failure of method, prolonged labor, need for further intervention, risk of cesarean.  Patient and family seem to understand these risks and wish to proceed.   Alexes Menchaca, VICKICNM, [place] 06/28/2015, 8:21 AM

## 2015-06-27 NOTE — Telephone Encounter (Signed)
Preadmission screen  

## 2015-06-28 ENCOUNTER — Inpatient Hospital Stay (HOSPITAL_COMMUNITY): Payer: BC Managed Care – PPO | Admitting: Anesthesiology

## 2015-06-28 ENCOUNTER — Encounter (HOSPITAL_COMMUNITY): Payer: Self-pay

## 2015-06-28 ENCOUNTER — Inpatient Hospital Stay (HOSPITAL_COMMUNITY)
Admission: RE | Admit: 2015-06-28 | Discharge: 2015-06-30 | DRG: 774 | Disposition: A | Discharge status: Home / Self Care | Payer: BC Managed Care – PPO | Source: Ambulatory Visit | Attending: Obstetrics and Gynecology | Admitting: Obstetrics and Gynecology

## 2015-06-28 VITALS — BP 117/68 | HR 80 | Temp 98.2°F | Resp 18 | Ht 69.0 in | Wt 160.0 lb

## 2015-06-28 DIAGNOSIS — K219 Gastro-esophageal reflux disease without esophagitis: Secondary | ICD-10-CM | POA: Diagnosis present

## 2015-06-28 DIAGNOSIS — Z833 Family history of diabetes mellitus: Secondary | ICD-10-CM | POA: Diagnosis not present

## 2015-06-28 DIAGNOSIS — O9081 Anemia of the puerperium: Secondary | ICD-10-CM | POA: Diagnosis not present

## 2015-06-28 DIAGNOSIS — Z87891 Personal history of nicotine dependence: Secondary | ICD-10-CM | POA: Diagnosis not present

## 2015-06-28 DIAGNOSIS — Z8249 Family history of ischemic heart disease and other diseases of the circulatory system: Secondary | ICD-10-CM | POA: Diagnosis not present

## 2015-06-28 DIAGNOSIS — D649 Anemia, unspecified: Secondary | ICD-10-CM | POA: Diagnosis not present

## 2015-06-28 DIAGNOSIS — O480 Post-term pregnancy: Principal | ICD-10-CM | POA: Diagnosis present

## 2015-06-28 DIAGNOSIS — O09523 Supervision of elderly multigravida, third trimester: Secondary | ICD-10-CM

## 2015-06-28 DIAGNOSIS — Z3A41 41 weeks gestation of pregnancy: Secondary | ICD-10-CM | POA: Diagnosis not present

## 2015-06-28 DIAGNOSIS — A6000 Herpesviral infection of urogenital system, unspecified: Secondary | ICD-10-CM | POA: Diagnosis present

## 2015-06-28 DIAGNOSIS — O9832 Other infections with a predominantly sexual mode of transmission complicating childbirth: Secondary | ICD-10-CM | POA: Diagnosis present

## 2015-06-28 DIAGNOSIS — O99284 Endocrine, nutritional and metabolic diseases complicating childbirth: Secondary | ICD-10-CM | POA: Diagnosis present

## 2015-06-28 DIAGNOSIS — R768 Other specified abnormal immunological findings in serum: Secondary | ICD-10-CM | POA: Diagnosis present

## 2015-06-28 DIAGNOSIS — Z8482 Family history of sudden infant death syndrome: Secondary | ICD-10-CM

## 2015-06-28 DIAGNOSIS — E0590 Thyrotoxicosis, unspecified without thyrotoxic crisis or storm: Secondary | ICD-10-CM | POA: Diagnosis present

## 2015-06-28 DIAGNOSIS — IMO0002 Reserved for concepts with insufficient information to code with codable children: Secondary | ICD-10-CM

## 2015-06-28 DIAGNOSIS — O9962 Diseases of the digestive system complicating childbirth: Secondary | ICD-10-CM | POA: Diagnosis present

## 2015-06-28 DIAGNOSIS — R7689 Other specified abnormal immunological findings in serum: Secondary | ICD-10-CM | POA: Diagnosis present

## 2015-06-28 LAB — CBC
HEMATOCRIT: 32.1 % — AB (ref 36.0–46.0)
HEMOGLOBIN: 11 g/dL — AB (ref 12.0–15.0)
MCH: 30.5 pg (ref 26.0–34.0)
MCHC: 34.3 g/dL (ref 30.0–36.0)
MCV: 88.9 fL (ref 78.0–100.0)
Platelets: 213 10*3/uL (ref 150–400)
RBC: 3.61 MIL/uL — ABNORMAL LOW (ref 3.87–5.11)
RDW: 13.9 % (ref 11.5–15.5)
WBC: 6 10*3/uL (ref 4.0–10.5)

## 2015-06-28 LAB — TYPE AND SCREEN
ABO/RH(D): O POS
ANTIBODY SCREEN: NEGATIVE

## 2015-06-28 MED ORDER — ACETAMINOPHEN 325 MG PO TABS
650.0000 mg | ORAL_TABLET | ORAL | Status: DC | PRN
  Administered 2015-06-29: 650 mg via ORAL
  Filled 2015-06-28: qty 2

## 2015-06-28 MED ORDER — SIMETHICONE 80 MG PO CHEW: 80.0000 mg | CHEWABLE_TABLET | ORAL | Status: DC | PRN

## 2015-06-28 MED ORDER — ONDANSETRON HCL 4 MG/2ML IJ SOLN: 4.0000 mg | Freq: Four times a day (QID) | INTRAMUSCULAR | Status: DC | PRN

## 2015-06-28 MED ORDER — OXYTOCIN 10 UNIT/ML IJ SOLN
1.0000 m[IU]/min | INTRAVENOUS | Status: DC
  Administered 2015-06-28: 1 m[IU]/min via INTRAVENOUS
  Filled 2015-06-28: qty 10

## 2015-06-28 MED ORDER — EPHEDRINE 5 MG/ML INJ
10.0000 mg | INTRAVENOUS | Status: DC | PRN
  Filled 2015-06-28: qty 2

## 2015-06-28 MED ORDER — WITCH HAZEL-GLYCERIN EX PADS: 1.0000 "application " | MEDICATED_PAD | CUTANEOUS | Status: DC | PRN

## 2015-06-28 MED ORDER — LIDOCAINE HCL (PF) 1 % IJ SOLN
INTRAMUSCULAR | Status: DC | PRN
  Administered 2015-06-28 (×2): 4 mL via EPIDURAL

## 2015-06-28 MED ORDER — DIPHENHYDRAMINE HCL 50 MG/ML IJ SOLN: 12.5000 mg | INTRAMUSCULAR | Status: DC | PRN

## 2015-06-28 MED ORDER — OXYTOCIN BOLUS FROM INFUSION: 500.0000 mL | INTRAVENOUS | Status: DC

## 2015-06-28 MED ORDER — ZOLPIDEM TARTRATE 5 MG PO TABS: 5.0000 mg | ORAL_TABLET | Freq: Every evening | ORAL | Status: DC | PRN

## 2015-06-28 MED ORDER — TERBUTALINE SULFATE 1 MG/ML IJ SOLN: 0.2500 mg | Freq: Once | INTRAMUSCULAR | Status: DC | PRN

## 2015-06-28 MED ORDER — IBUPROFEN 600 MG PO TABS
600.0000 mg | ORAL_TABLET | Freq: Four times a day (QID) | ORAL | Status: DC
  Administered 2015-06-28 – 2015-06-30 (×7): 600 mg via ORAL
  Filled 2015-06-28 (×8): qty 1

## 2015-06-28 MED ORDER — FENTANYL 2.5 MCG/ML BUPIVACAINE 1/10 % EPIDURAL INFUSION (WH - ANES)
14.0000 mL/h | INTRAMUSCULAR | Status: DC | PRN
  Administered 2015-06-28: 14 mL/h via EPIDURAL
  Filled 2015-06-28: qty 125

## 2015-06-28 MED ORDER — ONDANSETRON HCL 4 MG PO TABS: 4.0000 mg | ORAL_TABLET | ORAL | Status: DC | PRN

## 2015-06-28 MED ORDER — SENNOSIDES-DOCUSATE SODIUM 8.6-50 MG PO TABS
2.0000 | ORAL_TABLET | ORAL | Status: DC
  Administered 2015-06-28 – 2015-06-29 (×2): 2 via ORAL
  Filled 2015-06-28 (×2): qty 2

## 2015-06-28 MED ORDER — ONDANSETRON HCL 4 MG/2ML IJ SOLN: 4.0000 mg | INTRAMUSCULAR | Status: DC | PRN

## 2015-06-28 MED ORDER — LACTATED RINGERS IV SOLN: 500.0000 mL | Freq: Once | INTRAVENOUS | Status: DC

## 2015-06-28 MED ORDER — DIBUCAINE 1 % RE OINT: 1.0000 "application " | TOPICAL_OINTMENT | RECTAL | Status: DC | PRN

## 2015-06-28 MED ORDER — INFLUENZA VAC SPLIT QUAD 0.5 ML IM SUSY
0.5000 mL | PREFILLED_SYRINGE | INTRAMUSCULAR | Status: AC
  Administered 2015-06-29: 0.5 mL via INTRAMUSCULAR
  Filled 2015-06-28: qty 0.5

## 2015-06-28 MED ORDER — LIDOCAINE HCL (PF) 1 % IJ SOLN
30.0000 mL | INTRAMUSCULAR | Status: DC | PRN
  Filled 2015-06-28: qty 30

## 2015-06-28 MED ORDER — LACTATED RINGERS IV SOLN
500.0000 mL | INTRAVENOUS | Status: DC | PRN
  Administered 2015-06-28 (×2): 500 mL via INTRAVENOUS

## 2015-06-28 MED ORDER — PRENATAL MULTIVITAMIN CH
1.0000 | ORAL_TABLET | Freq: Every day | ORAL | Status: DC
  Administered 2015-06-29 – 2015-06-30 (×2): 1 via ORAL
  Filled 2015-06-28 (×2): qty 1

## 2015-06-28 MED ORDER — OXYCODONE-ACETAMINOPHEN 5-325 MG PO TABS: 1.0000 | ORAL_TABLET | ORAL | Status: DC | PRN

## 2015-06-28 MED ORDER — CITRIC ACID-SODIUM CITRATE 334-500 MG/5ML PO SOLN: 30.0000 mL | ORAL | Status: DC | PRN

## 2015-06-28 MED ORDER — OXYTOCIN 10 UNIT/ML IJ SOLN: 2.5000 [IU]/h | INTRAMUSCULAR | Status: DC

## 2015-06-28 MED ORDER — LACTATED RINGERS IV SOLN
INTRAVENOUS | Status: DC
  Administered 2015-06-28: 08:00:00 via INTRAVENOUS

## 2015-06-28 MED ORDER — LANOLIN HYDROUS EX OINT: TOPICAL_OINTMENT | CUTANEOUS | Status: DC | PRN

## 2015-06-28 MED ORDER — FENTANYL CITRATE (PF) 100 MCG/2ML IJ SOLN: 100.0000 ug | INTRAMUSCULAR | Status: DC | PRN

## 2015-06-28 MED ORDER — TETANUS-DIPHTH-ACELL PERTUSSIS 5-2.5-18.5 LF-MCG/0.5 IM SUSP
0.5000 mL | Freq: Once | INTRAMUSCULAR | Status: AC
  Administered 2015-06-29: 0.5 mL via INTRAMUSCULAR
  Filled 2015-06-28: qty 0.5

## 2015-06-28 MED ORDER — PHENYLEPHRINE 40 MCG/ML (10ML) SYRINGE FOR IV PUSH (FOR BLOOD PRESSURE SUPPORT)
80.0000 ug | PREFILLED_SYRINGE | INTRAVENOUS | Status: DC | PRN
  Filled 2015-06-28: qty 2
  Filled 2015-06-28: qty 20

## 2015-06-28 MED ORDER — FLEET ENEMA 7-19 GM/118ML RE ENEM: 1.0000 | ENEMA | RECTAL | Status: DC | PRN

## 2015-06-28 MED ORDER — DIPHENHYDRAMINE HCL 25 MG PO CAPS: 25.0000 mg | ORAL_CAPSULE | Freq: Four times a day (QID) | ORAL | Status: DC | PRN

## 2015-06-28 MED ORDER — PHENYLEPHRINE 40 MCG/ML (10ML) SYRINGE FOR IV PUSH (FOR BLOOD PRESSURE SUPPORT)
80.0000 ug | PREFILLED_SYRINGE | INTRAVENOUS | Status: DC | PRN
  Filled 2015-06-28: qty 2

## 2015-06-28 MED ORDER — OXYCODONE-ACETAMINOPHEN 5-325 MG PO TABS: 2.0000 | ORAL_TABLET | ORAL | Status: DC | PRN

## 2015-06-28 MED ORDER — ACETAMINOPHEN 325 MG PO TABS: 650.0000 mg | ORAL_TABLET | ORAL | Status: DC | PRN

## 2015-06-28 MED ORDER — BENZOCAINE-MENTHOL 20-0.5 % EX AERO
1.0000 "application " | INHALATION_SPRAY | CUTANEOUS | Status: DC | PRN
  Filled 2015-06-28: qty 56

## 2015-06-28 NOTE — Anesthesia Procedure Notes (Signed)
Epidural Patient location during procedure: OB Start time: 06/28/2015 5:50 PM  Staffing Anesthesiologist: Mal Amabile Performed by: anesthesiologist   Preanesthetic Checklist Completed: patient identified, site marked, surgical consent, pre-op evaluation, timeout performed, IV checked, risks and benefits discussed and monitors and equipment checked  Epidural Patient position: sitting Prep: site prepped and draped and DuraPrep Patient monitoring: continuous pulse ox and blood pressure Approach: midline Location: L3-L4 Injection technique: LOR air  Needle:  Needle type: Tuohy  Needle gauge: 17 G Needle length: 9 cm and 9 Needle insertion depth: 5 cm cm Catheter type: closed end flexible Catheter size: 19 Gauge Catheter at skin depth: 10 cm Test dose: negative and Other  Assessment Events: blood not aspirated, injection not painful, no injection resistance, negative IV test and no paresthesia  Additional Notes Patient identified. Risks and benefits discussed including failed block, incomplete  Pain control, post dural puncture headache, nerve damage, paralysis, blood pressure Changes, nausea, vomiting, reactions to medications-both toxic and allergic and post Partum back pain. All questions were answered. Patient expressed understanding and wished to proceed. Sterile technique was used throughout procedure. Epidural site was Dressed with sterile barrier dressing. No paresthesias, signs of intravascular injection Or signs of intrathecal spread were encountered.  Patient was more comfortable after the epidural was dosed. Please see RN's note for documentation of vital signs and FHR which are stable.

## 2015-06-28 NOTE — Anesthesia Preprocedure Evaluation (Signed)
Anesthesia Evaluation  Patient identified by MRN, date of birth, ID band Patient awake    Reviewed: Allergy & Precautions, NPO status , Patient's Chart, lab work & pertinent test results  Airway Mallampati: II  TM Distance: >3 FB Neck ROM: Full    Dental no notable dental hx. (+) Teeth Intact   Pulmonary former smoker,    Pulmonary exam normal breath sounds clear to auscultation       Cardiovascular negative cardio ROS Normal cardiovascular exam Rhythm:Regular Rate:Normal     Neuro/Psych negative neurological ROS  negative psych ROS   GI/Hepatic Neg liver ROS, GERD  ,  Endo/Other  Hyperthyroidism   Renal/GU negative Renal ROS  negative genitourinary   Musculoskeletal   Abdominal   Peds  Hematology  (+) anemia ,   Anesthesia Other Findings   Reproductive/Obstetrics (+) Pregnancy HSV                             Anesthesia Physical Anesthesia Plan  ASA: II  Anesthesia Plan: Epidural   Post-op Pain Management:    Induction:   Airway Management Planned: Natural Airway  Additional Equipment:   Intra-op Plan:   Post-operative Plan:   Informed Consent: I have reviewed the patients History and Physical, chart, labs and discussed the procedure including the risks, benefits and alternatives for the proposed anesthesia with the patient or authorized representative who has indicated his/her understanding and acceptance.     Plan Discussed with: Anesthesiologist  Anesthesia Plan Comments:         Anesthesia Quick Evaluation

## 2015-06-28 NOTE — Progress Notes (Signed)
  Subjective: Feeling a little more of the UCs, but "high pain tolerance"--requested VE to make sure she wasn't "missing her window of opportunity" for epidural.  Objective: BP 132/89 mmHg  Pulse 92  Temp(Src) 98.2 F (36.8 C) (Oral)  Resp 16  Ht  (1.753 m)  Wt 72.576 kg (160 lb)  BMI 23.62 kg/m2  LMP 09/14/2014      FHT: Category 1 UC:   regular, every 2-3 minutes SVE:   Dilation: 3 Effacement (%): 70 Station: -2 Exam by:: CNM Nigel Bridgeman Pitocin at 5 mu/min  Assessment:  Induction for post-dates GBS negative Early labor  Plan: Continue observation at present. Recheck cervix in 1-2 hours--AROM prn.  Patient may desire epidural prior to AROM.  Nigel Bridgeman CNM 06/28/2015, 3:22 PM

## 2015-06-28 NOTE — Progress Notes (Signed)
  Subjective: Comfortable with epidural.  Husband at work at group home in [place]--gets off at 10pm, but patient will likely call him prior to that time.  Objective: BP 123/71 mmHg  Pulse 83  Temp(Src) 99.2 F (37.3 C) (Oral)  Resp 16  Ht  (1.753 m)  Wt 72.576 kg (160 lb)  BMI 23.62 kg/m2  SpO2 99%  LMP 09/14/2014      FHT: Category 2, mild variables, moderate variability, accels present. UC:   irregular, every 2-5 minutes SVE:   Dilation: 6 Effacement (%): 80 Station: -2 Exam by:: CNM Nigel Bridgeman   Pitocin at 7 mu/min  Assessment:  Induction for postdates GBS negative  Plan: Deferred AROM at present due to patient's husband not here yet. Patient understands AROM may be required if further assessment of FHR is needed, or if labor plateaus.  Nigel Bridgeman CNM 06/28/2015, 6:45 PM

## 2015-06-28 NOTE — Consult Note (Signed)
Neonatology Note:  Attendance at Code Apgar:   Our team responded to a Code Apgar call to room # 174 following NSVD, due to infant with apnea. The requesting practioner was Venus Standard for Dr. Normand Sloop. The mother is a G4P3 O pos, GBS neg with hyperthyroidism and a history of a previous child with SIDS. ROM occurred 3 hours PTD and the fluid was clear. There were only a few variable FHR decelerations during labor, no protracted labor, no CAN or other problem at delivery. Mother had not gotten any narcotic medications, and was not on magnesium sulfate or SSRIs. She had a temperature of 37.7 degrees just before delivery. The baby's father had recently been diagnosed with genital HSV, so the mother was tested (positive), but has never had any lesions; she has been on daily Valtrex suppression for the past 28 days and had no lesions during labor. At delivery, the baby reportedly had good tone, cry, and color, with normal HR, but not breathing regularly/vigorously. After being placed on the mother's abdomen, he became limp and apneic and was moved to the radiant warmer.The OB nursing staff in attendance gave vigorous stimulation and a Code Apgar was called. Our team arrived at 5.5 minutes of life, at which time the baby was blue, apneic. R. White, RT began PPV and I continued PPV as the baby remained apneic. A pulse oximeter had been placed and the HR was > 100, with improving color with PPV. The baby began to have some respiratory effort and cried at about 7 minutes of life. We continued BBO2 due to O2 saturations of 77% in room air; once in the 90s, we withdrew the supplemental O2 and the baby maintained O2 saturations of 95%+ in room air. He had no distress, excellent perfusion, good tone, and clear lungs at 10 minutes. He did have a soft cardiac murmur at the LLSB, consistent with a transitional murmur. Ap 8/2/9.  I spoke with the mother in the DR, then transferred the baby to the Pediatrician's care.     Doretha Sou, MD

## 2015-06-28 NOTE — Consults (Signed)
  Anesthesia Pain Consult Note  Patient: Mikayla Baker, 40 y.o., female  Consult Requested by: Jaymes Graff, MD  Reason for Consult:Anesthesia  Assessment of Comfort Expectations and Level During Labor and Delivery  The patient has been admitted for induction of labor. She has given birth without an epidural and with an epidural in the past at [facility]. She desires epidural anesthesia when her labor is in progress.  Comfort Goal = 6/10 Comfort Level @ present = 0/10 0= no pain______________________10=worst pain  Mikayla Baker 06/28/2015

## 2015-06-28 NOTE — Progress Notes (Signed)
  Subjective: Aware of UCs, but not uncomfortable.  Objective: BP 144/86 mmHg  Pulse 90  Temp(Src) 98.6 F (37 C) (Oral)  Resp 18  Ht  (1.753 m)  Wt 72.576 kg (160 lb)  BMI 23.62 kg/m2  LMP 09/14/2014      Filed Vitals:   06/28/15 1201 06/28/15 1230 06/28/15 1300 06/28/15 1330  BP: 128/79 121/76 123/76 144/86  Pulse: 80 82 86 90  Temp:      TempSrc:      Resp:    18  Height:      Weight:        FHT: Category 1 at present--had sporadic late decels while on back, but resolved with position change UC:  q 2-4 min SVE:   2, 70%, vtx, -2, soft  Pitocin at 5 mu/min  Assessment:  IUP at 41 weeks Induction for postdates GBS negative  Plan: Continue current care Recheck with increase in contraction intensity.  Nigel Bridgeman CNM 06/28/2015, 1:58 PM

## 2015-06-29 LAB — CBC
HEMATOCRIT: 28.4 % — AB (ref 36.0–46.0)
HEMOGLOBIN: 9.7 g/dL — AB (ref 12.0–15.0)
MCH: 30.2 pg (ref 26.0–34.0)
MCHC: 34.2 g/dL (ref 30.0–36.0)
MCV: 88.5 fL (ref 78.0–100.0)
Platelets: 192 10*3/uL (ref 150–400)
RBC: 3.21 MIL/uL — ABNORMAL LOW (ref 3.87–5.11)
RDW: 13.8 % (ref 11.5–15.5)
WBC: 7.7 10*3/uL (ref 4.0–10.5)

## 2015-06-29 LAB — CCBB MATERNAL DONOR DRAW

## 2015-06-29 LAB — HIV ANTIBODY (ROUTINE TESTING W REFLEX): HIV SCREEN 4TH GENERATION: NONREACTIVE

## 2015-06-29 LAB — RPR: RPR: NONREACTIVE

## 2015-06-29 NOTE — Progress Notes (Signed)
Post Partum Day 1 Subjective: no complaints and up ad lib  Objective: Blood pressure 115/72, pulse 73, temperature 98.6 F (37 C), temperature source Oral, resp. rate 18, height  (1.753 m), weight 160 lb (72.576 kg), last menstrual period 09/14/2014, SpO2 100 %, unknown if currently breastfeeding.  Physical Exam:  General: alert and cooperative Lochia: appropriate Uterine Fundus: firm Incision: na DVT Evaluation: No evidence of DVT seen on physical exam.   Recent Labs  06/28/15 0805 06/29/15 0535  HGB 11.0* 9.7*  HCT 32.1* 28.4*    Assessment/Plan: Plan for discharge tomorrow and Breastfeeding  Plans nexplanon for BC   LOS: 1 day   Howard Bunte A 06/29/2015, 5:28 PM

## 2015-06-29 NOTE — Lactation Note (Signed)
This note was copied from a baby's chart. Lactation Consultation Note  Initial visit made.  Mom has been given Providing Breastmilk For Your Baby in NICU.  She initiated pumping with symphony pump yesterday and obtaining small amounts of milk.  Instructed to continue to pump every 3 hours and call for assist prn.  Mom denies questions or concerns at present time.  Mom has a DEBP at home.  Patient Name: Mikayla Baker Date: 06/29/2015 Reason for consult: Initial assessment;NICU baby   Maternal Data    Feeding Feeding Type: Formula Nipple Type: Slow - flow Length of feed: 10 min  LATCH Score/Interventions                      Lactation Tools Discussed/Used WIC Program: No Pump Review: Setup, frequency, and cleaning;Milk Storage Initiated by:: RN Date initiated:: 06/28/15   Consult Status Consult Status: Follow-up Date: 06/30/15 Follow-up type: In-patient    Huston Foley 06/29/2015, 11:51 AM

## 2015-06-29 NOTE — Anesthesia Postprocedure Evaluation (Signed)
Anesthesia Post Note  Patient: Mikayla Baker  Procedure(s) Performed: * No procedures listed *  Patient location during evaluation: [facility] Anesthesia Type: Epidural Level of consciousness: awake, awake and alert, oriented and patient cooperative Pain management: pain level controlled Vital Signs Assessment: post-procedure vital signs reviewed and stable Respiratory status: spontaneous breathing, nonlabored ventilation and respiratory function stable Cardiovascular status: stable Postop Assessment: no headache, no backache, patient able to bend at knees and no signs of nausea or vomiting Anesthetic complications: no    Last Vitals:  Filed Vitals:   06/28/15 2246 06/29/15 0244  BP: 131/55 109/54  Pulse: 93 78  Temp: 36.9 C 36.9 C  Resp: 18 18    Last Pain:  Filed Vitals:   06/29/15 0644  PainSc: Asleep                 Jaana Brodt L

## 2015-06-30 MED ORDER — ACETAMINOPHEN 325 MG PO TABS: 650.0000 mg | ORAL_TABLET | ORAL | Status: DC | PRN

## 2015-06-30 MED ORDER — IBUPROFEN 600 MG PO TABS: 600.0000 mg | ORAL_TABLET | Freq: Four times a day (QID) | ORAL | Status: DC | PRN

## 2015-06-30 NOTE — Clinical Social Work Maternal (Signed)
CLINICAL SOCIAL WORK MATERNAL/CHILD NOTE  Patient Details  Name: Mikayla Baker MRN: [id number] Date of Birth: 1975/07/09  Date:  06/30/2015  Clinical Social Worker Initiating Note:  Jezabella Schriever E. Brigitte Pulse, [place] Date/ Time Initiated:  06/30/15/1030     Child's Name:  Denim   Legal Guardian:   (Parents: Sharol Roussel and Newman Nip)   Need for Interpreter:  None   Date of Referral:        Reason for Referral:   (No referral-NICU admission)   Referral Source:      Address:  [address], [place], [place] [address]  Phone number:  [phone]   Household Members:  Minor Children (MOB lives with her two other children, ages 55 and 82.  FOB states he lives with his mom approximately 5 minutes from MOB.  )   Natural Supports (not living in the home):  Immediate Family, Friends, Extended Family (Parents report that they have a good support system.)   Professional Supports: None   Employment:     Type of Work:     Education:      Pensions consultant:  Multimedia programmer   Other Resources:      Cultural/Religious Considerations Which May Impact Care: None stated.    Strengths:  Ability to meet basic needs , Compliance with medical plan , Understanding of illness, Home prepared for child  (MOB states her other children go to [facility] on [address], but she wants to find a different pediatrician for baby as she finds it difficult to get appointments at [facility].  She states she has a list and plans to make a decision today.)   Risk Factors/Current Problems:  None   Cognitive State:  Alert , Linear Thinking , Goal Oriented , Insightful    Mood/Affect:  Euthymic , Interested , Relaxed , Comfortable    CSW Assessment: CSW met with parents in MOB's third floor room/306 to introduce services, offer support, and complete assessment due to baby's admission to NICU at 41 weeks.  Parents were pleasant and welcoming of CSW's visit.  CSW found it easy to engage with parents.   MOB reports  that baby is doing very well and should be able to discharge soon.  She states eating is the only thing keeping in the NICU at this time and that he took a full bottle for his last feeding.  MOB talked about her delivery and reason for baby's admission to NICU.  She states she was calm and that she expected something to go wrong, since that has been her experience with all of her other births.  She notes that her other babies were born with the cords around their necks and not breathing.  She reports that this is her first experience with a baby in the NICU, but feels she is coping well.  CSW inquired about her other children and she reports that she has two sons at home, ages 93 and 82 and that she had a son die from SIDS at 40 months who would be 3 in May.  She seemed comfortable telling CSW about this loss, but did not seem to want to engage in further discussion about it.  Parents state that their families are supportive and that they have everything they need for baby at home.  This is the couple's first child together.  FOB has three other children, whom he sees or talks to regularly, but do not live with him. CSW inquired about MOB's emotional experience after her  previous deliveries and she states no concerns.  CSW provided education on signs and symptoms of PPD to watch for.  Parents were attentive and state no questions or concerns.   Parents report no issues with transportation to get to the hospital frequently while baby is here.  CSW explained ongoing support services offered by NICU CSW and gave contact information.  Parents seemed appreciative.    CSW Plan/Description:  Patient/Family Education , No Further Intervention Required/No Barriers to Discharge    Alphonzo Cruise, [place] 06/30/2015, 2:53 PM

## 2015-06-30 NOTE — Lactation Note (Signed)
This note was copied from a baby's chart. Lactation Consultation Note  Patient Name: Mikayla Baker NFAOZ'HToday's Date: 06/30/2015 Reason for consult: Follow-up assessment;NICU baby  NICU baby 2838 hours old, 5363w0d GA. MOB had partial thyroid ablation in 2010 d/t hyperthyroidism, and is on no medication. Mom states that she nurse her first 3 children for a year each. Mom reports that she has a DEBP at home and is aware of pumping rooms in the NICU. Mom aware of OP/BFSG and LC phone line assistance after D/C. Maternal Data Has patient been taught Hand Expression?: Yes (per mom.) Does the patient have breastfeeding experience prior to this delivery?: Yes  Feeding Feeding Type: Formula Nipple Type: Slow - flow  LATCH Score/Interventions                      Lactation Tools Discussed/Used     Consult Status Consult Status: Follow-up Date: 07/01/15 Follow-up type: In-patient    Geralynn OchsWILLIARD, Breanne Olvera 06/30/2015, 10:34 AM

## 2015-06-30 NOTE — Discharge Instructions (Signed)
Postpartum Care After Vaginal Delivery °After you deliver your newborn (postpartum period), the usual stay in the hospital is 24-72 hours. If there were problems with your labor or delivery, or if you have other medical problems, you might be in the hospital longer.  °While you are in the hospital, you will receive help and instructions on how to care for yourself and your newborn during the postpartum period.  °While you are in the hospital: °· Be sure to tell your nurses if you have pain or discomfort, as well as where you feel the pain and what makes the pain worse. °· If you had an incision made near your vagina (episiotomy) or if you had some tearing during delivery, the nurses may put ice packs on your episiotomy or tear. The ice packs may help to reduce the pain and swelling. °· If you are breastfeeding, you may feel uncomfortable contractions of your uterus for a couple of weeks. This is normal. The contractions help your uterus get back to normal size. °· It is normal to have some bleeding after delivery. °· For the first 1-3 days after delivery, the flow is red and the amount may be similar to a period. °· It is common for the flow to start and stop. °· In the first few days, you may pass some small clots. Let your nurses know if you begin to pass large clots or your flow increases. °· Do not  flush blood clots down the toilet before having the nurse look at them. °· During the next 3-10 days after delivery, your flow should become more watery and pink or brown-tinged in color. °· Ten to fourteen days after delivery, your flow should be a small amount of yellowish-white discharge. °· The amount of your flow will decrease over the first few weeks after delivery. Your flow may stop in 6-8 weeks. Most women have had their flow stop by 12 weeks after delivery. °· You should change your sanitary pads frequently. °· Wash your hands thoroughly with soap and water for at least 20 seconds after changing pads, using  the toilet, or before holding or feeding your newborn. °· You should feel like you need to empty your bladder within the first 6-8 hours after delivery. °· In case you become weak, lightheaded, or faint, call your nurse before you get out of bed for the first time and before you take a shower for the first time. °· Within the first few days after delivery, your breasts may begin to feel tender and full. This is called engorgement. Breast tenderness usually goes away within 48-72 hours after engorgement occurs. You may also notice milk leaking from your breasts. If you are not breastfeeding, do not stimulate your breasts. Breast stimulation can make your breasts produce more milk. °· Spending as much time as possible with your newborn is very important. During this time, you and your newborn can feel close and get to know each other. Having your newborn stay in your room (rooming in) will help to strengthen the bond with your newborn.  It will give you time to get to know your newborn and become comfortable caring for your newborn. °· Your hormones change after delivery. Sometimes the hormone changes can temporarily cause you to feel sad or tearful. These feelings should not last more than a few days. If these feelings last longer than that, you should talk to your caregiver. °· If desired, talk to your caregiver about methods of family planning or contraception. °·   Talk to your caregiver about immunizations. Your caregiver may want you to have the following immunizations before leaving the hospital:  Tetanus, diphtheria, and pertussis (Tdap) or tetanus and diphtheria (Td) immunization. It is very important that you and your family (including grandparents) or others caring for your newborn are up-to-date with the Tdap or Td immunizations. The Tdap or Td immunization can help protect your newborn from getting ill.  Rubella immunization.  Varicella (chickenpox) immunization.  Influenza immunization. You should  receive this annual immunization if you did not receive the immunization during your pregnancy.   This information is not intended to replace advice given to you by your health care provider. Make sure you discuss any questions you have with your health care provider.   Document Released: 02/10/2007 Document Revised: 01/08/2012 Document Reviewed: 12/11/2011 Elsevier Interactive Patient Education [employer]. Breastfeeding Deciding to breastfeed is one of the best choices you can make for you and your baby. A change in hormones during pregnancy causes your breast tissue to grow and increases the number and size of your milk ducts. These hormones also allow proteins, sugars, and fats from your blood supply to make breast milk in your milk-producing glands. Hormones prevent breast milk from being released before your baby is born as well as prompt milk flow after birth. Once breastfeeding has begun, thoughts of your baby, as well as his or her sucking or crying, can stimulate the release of milk from your milk-producing glands.  BENEFITS OF BREASTFEEDING For Your Baby  Your first milk (colostrum) helps your baby's digestive system function better.  There are antibodies in your milk that help your baby fight off infections.  Your baby has a lower incidence of asthma, allergies, and sudden infant death syndrome.  The nutrients in breast milk are better for your baby than infant formulas and are designed uniquely for your baby's needs.  Breast milk improves your baby's brain development.  Your baby is less likely to develop other conditions, such as childhood obesity, asthma, or type 2 diabetes mellitus. For You  Breastfeeding helps to create a very special bond between you and your baby.  Breastfeeding is convenient. Breast milk is always available at the correct temperature and costs nothing.  Breastfeeding helps to burn calories and helps you lose the weight gained during  pregnancy.  Breastfeeding makes your uterus contract to its prepregnancy size faster and slows bleeding (lochia) after you give birth.   Breastfeeding helps to lower your risk of developing type 2 diabetes mellitus, osteoporosis, and breast or ovarian cancer later in life. SIGNS THAT YOUR BABY IS HUNGRY Early Signs of Hunger  Increased alertness or activity.  Stretching.  Movement of the head from side to side.  Movement of the head and opening of the mouth when the corner of the mouth or cheek is stroked (rooting).  Increased sucking sounds, smacking lips, cooing, sighing, or squeaking.  Hand-to-mouth movements.  Increased sucking of fingers or hands. Late Signs of Hunger  Fussing.  Intermittent crying. Extreme Signs of Hunger Signs of extreme hunger will require calming and consoling before your baby will be able to breastfeed successfully. Do not wait for the following signs of extreme hunger to occur before you initiate breastfeeding:  Restlessness.  A loud, strong cry.  Screaming. BREASTFEEDING BASICS Breastfeeding Initiation  Find a comfortable place to sit or lie down, with your neck and back well supported.  Place a pillow or rolled up blanket under your baby to bring him or  her to the level of your breast (if you are seated). Nursing pillows are specially designed to help support your arms and your baby while you breastfeed.  Make sure that your baby's abdomen is facing your abdomen.  Gently massage your breast. With your fingertips, massage from your chest wall toward your nipple in a circular motion. This encourages milk flow. You may need to continue this action during the feeding if your milk flows slowly.  Support your breast with 4 fingers underneath and your thumb above your nipple. Make sure your fingers are well away from your nipple and your baby's mouth.  Stroke your baby's lips gently with your finger or nipple.  When your baby's mouth is open  wide enough, quickly bring your baby to your breast, placing your entire nipple and as much of the colored area around your nipple (areola) as possible into your baby's mouth.  More areola should be visible above your baby's upper lip than below the lower lip.  Your baby's tongue should be between his or her lower gum and your breast.  Ensure that your baby's mouth is correctly positioned around your nipple (latched). Your baby's lips should create a seal on your breast and be turned out (everted).  It is common for your baby to suck about 2-3 minutes in order to start the flow of breast milk. Latching Teaching your baby how to latch on to your breast properly is very important. An improper latch can cause nipple pain and decreased milk supply for you and poor weight gain in your baby. Also, if your baby is not latched onto your nipple properly, he or she may swallow some air during feeding. This can make your baby fussy. Burping your baby when you switch breasts during the feeding can help to get rid of the air. However, teaching your baby to latch on properly is still the best way to prevent fussiness from swallowing air while breastfeeding. Signs that your baby has successfully latched on to your nipple:  Silent tugging or silent sucking, without causing you pain.  Swallowing heard between every 3-4 sucks.  Muscle movement above and in front of his or her ears while sucking. Signs that your baby has not successfully latched on to nipple:  Sucking sounds or smacking sounds from your baby while breastfeeding.  Nipple pain. If you think your baby has not latched on correctly, slip your finger into the corner of your baby's mouth to break the suction and place it between your baby's gums. Attempt breastfeeding initiation again. Signs of Successful Breastfeeding Signs from your baby:  A gradual decrease in the number of sucks or complete cessation of sucking.  Falling asleep.  Relaxation  of his or her body.  Retention of a small amount of milk in his or her mouth.  Letting go of your breast by himself or herself. Signs from you:  Breasts that have increased in firmness, weight, and size 1-3 hours after feeding.  Breasts that are softer immediately after breastfeeding.  Increased milk volume, as well as a change in milk consistency and color by the fifth day of breastfeeding.  Nipples that are not sore, cracked, or bleeding. Signs That Your [employer] is Getting Enough Milk  Wetting at least 3 diapers in a 24-hour period. The urine should be clear and pale yellow by age 35 days.  At least 3 stools in a 24-hour period by age 35 days. The stool should be soft and yellow.  At least 3  stools in a 24-hour period by age 39 days. The stool should be seedy and yellow.  No loss of weight greater than 10% of birth weight during the first 43 days of age.  Average weight gain of 4-7 ounces (113-198 g) per week after age 32 days.  Consistent daily weight gain by age 68 days, without weight loss after the age of 2 weeks. After a feeding, your baby may spit up a small amount. This is common. BREASTFEEDING FREQUENCY AND DURATION Frequent feeding will help you make more milk and can prevent sore nipples and breast engorgement. Breastfeed when you feel the need to reduce the fullness of your breasts or when your baby shows signs of hunger. This is called "breastfeeding on demand." Avoid introducing a pacifier to your baby while you are working to establish breastfeeding (the first 4-6 weeks after your baby is born). After this time you may choose to use a pacifier. Research has shown that pacifier use during the first year of a baby's life decreases the risk of sudden infant death syndrome (SIDS). Allow your baby to feed on each breast as long as he or she wants. Breastfeed until your baby is finished feeding. When your baby unlatches or falls asleep while feeding from the first breast, offer the  second breast. Because newborns are often sleepy in the first few weeks of life, you may need to awaken your baby to get him or her to feed. Breastfeeding times will vary from baby to baby. However, the following rules can serve as a guide to help you ensure that your baby is properly fed:  Newborns (babies 85 weeks of age or younger) may breastfeed every 1-3 hours.  Newborns should not go longer than 3 hours during the day or 5 hours during the night without breastfeeding.  You should breastfeed your baby a minimum of 8 times in a 24-hour period until you begin to introduce solid foods to your baby at around 59 months of age. BREAST MILK PUMPING Pumping and storing breast milk allows you to ensure that your baby is exclusively fed your breast milk, even at times when you are unable to breastfeed. This is especially important if you are going back to work while you are still breastfeeding or when you are not able to be present during feedings. Your lactation consultant can give you guidelines on how long it is safe to store breast milk. A breast pump is a machine that allows you to pump milk from your breast into a sterile bottle. The pumped breast milk can then be stored in a refrigerator or freezer. Some breast pumps are operated by hand, while others use electricity. Ask your lactation consultant which type will work best for you. Breast pumps can be purchased, but some hospitals and breastfeeding support groups lease breast pumps on a monthly basis. A lactation consultant can teach you how to hand express breast milk, if you prefer not to use a pump. CARING FOR YOUR BREASTS WHILE YOU BREASTFEED Nipples can become dry, cracked, and sore while breastfeeding. The following recommendations can help keep your breasts moisturized and healthy:  Avoid using soap on your nipples.  Wear a supportive bra. Although not required, special nursing bras and tank tops are designed to allow access to your breasts  for breastfeeding without taking off your entire bra or top. Avoid wearing underwire-style bras or extremely tight bras.  Air dry your nipples for 3-58mnutes after each feeding.  Use only cotton bra pads  to absorb leaked breast milk. Leaking of breast milk between feedings is normal.  Use lanolin on your nipples after breastfeeding. Lanolin helps to maintain your skin's normal moisture barrier. If you use pure lanolin, you do not need to wash it off before feeding your baby again. Pure lanolin is not toxic to your baby. You may also hand express a few drops of breast milk and gently massage that milk into your nipples and allow the milk to air dry. In the first few weeks after giving birth, some women experience extremely full breasts (engorgement). Engorgement can make your breasts feel heavy, warm, and tender to the touch. Engorgement peaks within 3-5 days after you give birth. The following recommendations can help ease engorgement:  Completely empty your breasts while breastfeeding or pumping. You may want to start by applying warm, moist heat (in the shower or with warm water-soaked hand towels) just before feeding or pumping. This increases circulation and helps the milk flow. If your baby does not completely empty your breasts while breastfeeding, pump any extra milk after he or she is finished.  Wear a snug bra (nursing or regular) or tank top for 1-2 days to signal your body to slightly decrease milk production.  Apply ice packs to your breasts, unless this is too uncomfortable for you.  Make sure that your baby is latched on and positioned properly while breastfeeding. If engorgement persists after 48 hours of following these recommendations, contact your health care provider or a Science writer. OVERALL HEALTH CARE RECOMMENDATIONS WHILE BREASTFEEDING  Eat healthy foods. Alternate between meals and snacks, eating 3 of each per day. Because what you eat affects your breast milk,  some of the foods may make your baby more irritable than usual. Avoid eating these foods if you are sure that they are negatively affecting your baby.  Drink milk, fruit juice, and water to satisfy your thirst (about 10 glasses a day).  Rest often, relax, and continue to take your prenatal vitamins to prevent fatigue, stress, and anemia.  Continue breast self-awareness checks.  Avoid chewing and smoking tobacco. Chemicals from cigarettes that pass into breast milk and exposure to secondhand smoke may harm your baby.  Avoid alcohol and drug use, including marijuana. Some medicines that may be harmful to your baby can pass through breast milk. It is important to ask your health care provider before taking any medicine, including all over-the-counter and prescription medicine as well as vitamin and herbal supplements. It is possible to become pregnant while breastfeeding. If birth control is desired, ask your health care provider about options that will be safe for your baby. SEEK MEDICAL CARE IF:  You feel like you want to stop breastfeeding or have become frustrated with breastfeeding.  You have painful breasts or nipples.  Your nipples are cracked or bleeding.  Your breasts are red, tender, or warm.  You have a swollen area on either breast.  You have a fever or chills.  You have nausea or vomiting.  You have drainage other than breast milk from your nipples.  Your breasts do not become full before feedings by the fifth day after you give birth.  You feel sad and depressed.  Your baby is too sleepy to eat well.  Your baby is having trouble sleeping.   Your baby is wetting less than 3 diapers in a 24-hour period.  Your baby has less than 3 stools in a 24-hour period.  Your baby's skin or the white part of his  or her eyes becomes yellow.   Your baby is not gaining weight by 82 days of age. SEEK IMMEDIATE MEDICAL CARE IF:  Your baby is overly tired (lethargic) and does  not want to wake up and feed.  Your baby develops an unexplained fever.   This information is not intended to replace advice given to you by your health care provider. Make sure you discuss any questions you have with your health care provider.   Document Released: 04/15/2005 Document Revised: 01/04/2015 Document Reviewed: 10/07/2012 Elsevier Interactive Patient Education 2016 [employer].  Pregnancy and Anemia Anemia is a condition in which the concentration of red blood cells or hemoglobin in the blood is below normal. Hemoglobin is a substance in red blood cells that carries oxygen to the tissues of the body. Anemia results in not enough oxygen reaching these tissues.  Anemia during pregnancy is common because the fetus uses more iron and folic acid as it is developing. Your body may not produce enough red blood cells because of this. Also, during pregnancy, the liquid part of the blood (plasma) increases by about 50%, and the red blood cells increase by only 25%. This lowers the concentration of the red blood cells and creates a natural anemia-like situation.  CAUSES  The most common cause of anemia during pregnancy is not having enough iron in the body to make red blood cells (iron deficiency anemia). Other causes may include:  Folic acid deficiency.  Vitamin B12 deficiency.  Certain prescription or over-the-counter medicines.  Certain medical conditions or infections that destroy red blood cells.  A low platelet count and bleeding caused by antibodies that go through the placenta to the fetus from the mother's blood. SIGNS AND SYMPTOMS  Mild anemia may not be noticeable. If it becomes severe, symptoms may include:  Tiredness.  Shortness of breath, especially with exercise.  Weakness.  Fainting.  Pale looking skin.  Headaches.  Feeling a fast or irregular heartbeat (palpitations). DIAGNOSIS  The type of anemia is usually diagnosed from your family and medical history  and blood tests. TREATMENT  Treatment of anemia during pregnancy depends on the cause of the anemia. Treatment can include:  Supplements of iron, vitamin X90, or folic acid.  A blood transfusion. This may be needed if blood loss is severe.  Hospitalization. This may be needed if there is significant continual blood loss.  Dietary changes. HOME CARE INSTRUCTIONS   Follow your dietitian's or health care provider's dietary recommendations.  Increase your vitamin C intake. This will help the stomach absorb more iron.  Eat a diet rich in iron. This would include foods such as:  Liver.  Beef.  Whole grain bread.  Eggs.  Dried fruit.  Take iron and vitamins as directed by your health care provider.  Eat green leafy vegetables. These are a good source of folic acid. SEEK MEDICAL CARE IF:   You have frequent or lasting headaches.  You are looking pale.  You are bruising easily. SEEK IMMEDIATE MEDICAL CARE IF:   You have extreme weakness, shortness of breath, or chest pain.  You become dizzy or have trouble concentrating.  You have heavy vaginal bleeding.  You develop a rash.  You have bloody or black, tarry stools.  You faint.  You vomit up blood.  You vomit repeatedly.  You have abdominal pain.  You have a fever or persistent symptoms for more than 2-3 days.  You have a fever and your symptoms suddenly get worse.  You are  dehydrated. MAKE SURE YOU:   Understand these instructions.  Will watch your condition.  Will get help right away if you are not doing well or get worse.   This information is not intended to replace advice given to you by your health care provider. Make sure you discuss any questions you have with your health care provider.   Document Released: 04/12/2000 Document Revised: 02/03/2013 Document Reviewed: 11/25/2012 Elsevier Interactive Patient Education 2016 [employer] Iron-Rich Diet Iron is a mineral that helps your body to  produce hemoglobin. Hemoglobin is a protein in your red blood cells that carries oxygen to your body's tissues. Eating too little iron may cause you to feel weak and tired, and it can increase your risk for infection. Eating enough iron is necessary for your body's metabolism, muscle function, and nervous system. Iron is naturally found in many foods. It can also be added to foods or fortified in foods. There are two types of dietary iron:  Heme iron. Heme iron is absorbed by the body more easily than nonheme iron. Heme iron is found in meat, poultry, and fish.  Nonheme iron. Nonheme iron is found in dietary supplements, iron-fortified grains, beans, and vegetables. You may need to follow an iron-rich diet if:  You have been diagnosed with iron deficiency or iron-deficiency anemia.  You have a condition that prevents you from absorbing dietary iron, such as:  Infection in your intestines.  Celiac disease. This involves long-lasting (chronic) inflammation of your intestines.  You do not eat enough iron.  You eat a diet that is high in foods that impair iron absorption.  You have lost a lot of blood.  You have heavy bleeding during your menstrual cycle.  You are pregnant. WHAT IS MY PLAN? Your health care provider may help you to determine how much iron you need per day based on your condition. Generally, when a person consumes sufficient amounts of iron in the diet, the following iron needs are met:  Men.  71-67 years old: 11 mg per day.  61-85 years old: 8 mg per day.  Women.   87-88 years old: 15 mg per day.  72-26 years old: 18 mg per day.  Over 62 years old: 8 mg per day.  Pregnant women: 27 mg per day.  Breastfeeding women: 9 mg per day. WHAT DO I NEED TO KNOW ABOUT AN IRON-RICH DIET?  Eat fresh fruits and vegetables that are high in vitamin C along with foods that are high in iron. This will help increase the amount of iron that your body absorbs from food,  especially with foods containing nonheme iron. Foods that are high in vitamin C include oranges, peppers, tomatoes, and mango.  Take iron supplements only as directed by your health care provider. Overdose of iron can be life-threatening. If you were prescribed iron supplements, take them with orange juice or a vitamin C supplement.  Cook foods in pots and pans that are made from iron.   Eat nonheme iron-containing foods alongside foods that are high in heme iron. This helps to improve your iron absorption.   Certain foods and drinks contain compounds that impair iron absorption. Avoid eating these foods in the same meal as iron-rich foods or with iron supplements. These include:  Coffee, black tea, and red wine.  Milk, dairy products, and foods that are high in calcium.  Beans, soybeans, and peas.  Whole grains.  When eating foods that contain both nonheme iron and compounds that impair iron  absorption, follow these tips to absorb iron better.   Soak beans overnight before cooking.  Soak whole grains overnight and drain them before using.  Ferment flours before baking, such as using yeast in bread dough. WHAT FOODS CAN I EAT? Grains Iron-fortified breakfast cereal. Iron-fortified whole-wheat bread. Enriched rice. Sprouted grains. Vegetables Spinach. Potatoes with skin. Green peas. Broccoli. Red and green bell peppers. Fermented vegetables. Fruits Prunes. Raisins. Oranges. Strawberries. Mango. Grapefruit. Meats and Other Protein Sources Beef liver. Oysters. Beef. Shrimp. [place]. Chicken. [place]. Sardines. Chickpeas. Nuts. Tofu. Beverages Tomato juice. Fresh orange juice. Prune juice. Hibiscus tea. Fortified instant breakfast shakes. Condiments Tahini. Fermented soy sauce. Sweets and Desserts Black-strap molasses.  Other Wheat germ. The items listed above may not be a complete list of recommended foods or beverages. Contact your dietitian for more options. WHAT  FOODS ARE NOT RECOMMENDED? Grains Whole grains. Bran cereal. Bran flour. Oats. Vegetables Artichokes. Brussels sprouts. Kale. Fruits Blueberries. Raspberries. Strawberries. Figs. Meats and Other Protein Sources Soybeans. Products made from soy protein. Dairy Milk. Cream. Cheese. Yogurt. Cottage cheese. Beverages Coffee. Black tea. Red wine. Sweets and Desserts Cocoa. Chocolate. Ice cream. Other Basil. Oregano. Parsley. The items listed above may not be a complete list of foods and beverages to avoid. Contact your dietitian for more information.   This information is not intended to replace advice given to you by your health care provider. Make sure you discuss any questions you have with your health care provider.   Document Released: 11/27/2004 Document Revised: 05/06/2014 Document Reviewed: 11/10/2013 Elsevier Interactive Patient Education 2016 [employer]. Postpartum Depression and Baby Blues The postpartum period begins right after the birth of a baby. During this time, there is often a great amount of joy and excitement. It is also a time of many changes in the life of the parents. Regardless of how many times a mother gives birth, each child brings new challenges and dynamics to the family. It is not unusual to have feelings of excitement along with confusing shifts in moods, emotions, and thoughts. All mothers are at risk of developing postpartum depression or the "baby blues." These mood changes can occur right after giving birth, or they may occur many months after giving birth. The baby blues or postpartum depression can be mild or severe. Additionally, postpartum depression can go away rather quickly, or it can be a long-term condition.  CAUSES Raised hormone levels and the rapid drop in those levels are thought to be a main cause of postpartum depression and the baby blues. A number of hormones change during and after pregnancy. Estrogen and progesterone usually  decrease right after the delivery of your baby. The levels of thyroid hormone and various cortisol steroids also rapidly drop. Other factors that play a role in these mood changes include major life events and genetics.  RISK FACTORS If you have any of the following risks for the baby blues or postpartum depression, know what symptoms to watch out for during the postpartum period. Risk factors that may increase the likelihood of getting the baby blues or postpartum depression include:  Having a personal or family history of depression.   Having depression while being pregnant.   Having premenstrual mood issues or mood issues related to oral contraceptives.  Having a lot of life stress.   Having marital conflict.   Lacking a social support network.   Having a baby with special needs.   Having health problems, such as diabetes.  SIGNS AND SYMPTOMS Symptoms of baby  blues include:  Brief changes in mood, such as going from extreme happiness to sadness.  Decreased concentration.   Difficulty sleeping.   Crying spells, tearfulness.   Irritability.   Anxiety.  Symptoms of postpartum depression typically begin within the first month after giving birth. These symptoms include:  Difficulty sleeping or excessive sleepiness.   Marked weight loss.   Agitation.   Feelings of worthlessness.   Lack of interest in activity or food.  Postpartum psychosis is a very serious condition and can be dangerous. Fortunately, it is rare. Displaying any of the following symptoms is cause for immediate medical attention. Symptoms of postpartum psychosis include:   Hallucinations and delusions.   Bizarre or disorganized behavior.   Confusion or disorientation.  DIAGNOSIS  A diagnosis is made by an evaluation of your symptoms. There are no medical or lab tests that lead to a diagnosis, but there are various questionnaires that a health care provider may use to identify those  with the baby blues, postpartum depression, or psychosis. Often, a screening tool called the [place] Postnatal Depression Scale is used to diagnose depression in the postpartum period.  TREATMENT The baby blues usually goes away on its own in 1-2 weeks. Social support is often all that is needed. You will be encouraged to get adequate sleep and rest. Occasionally, you may be given medicines to help you sleep.  Postpartum depression requires treatment because it can last several months or longer if it is not treated. Treatment may include individual or group therapy, medicine, or both to address any social, physiological, and psychological factors that may play a role in the depression. Regular exercise, a healthy diet, rest, and social support may also be strongly recommended.  Postpartum psychosis is more serious and needs treatment right away. Hospitalization is often needed. HOME CARE INSTRUCTIONS  Get as much rest as you can. Nap when the baby sleeps.   Exercise regularly. Some women find yoga and walking to be beneficial.   Eat a balanced and nourishing diet.   Do little things that you enjoy. Have a cup of tea, take a bubble bath, read your favorite magazine, or listen to your favorite music.  Avoid alcohol.   Ask for help with household chores, cooking, grocery shopping, or running errands as needed. Do not try to do everything.   Talk to people close to you about how you are feeling. Get support from your partner, family members, friends, or other new moms.  Try to stay positive in how you think. Think about the things you are grateful for.   Do not spend a lot of time alone.   Only take over-the-counter or prescription medicine as directed by your health care provider.  Keep all your postpartum appointments.   Let your health care provider know if you have any concerns.  SEEK MEDICAL CARE IF: You are having a reaction to or problems with your medicine. SEEK  IMMEDIATE MEDICAL CARE IF:  You have suicidal feelings.   You think you may harm the baby or someone else. MAKE SURE YOU:  Understand these instructions.  Will watch your condition.  Will get help right away if you are not doing well or get worse.   This information is not intended to replace advice given to you by your health care provider. Make sure you discuss any questions you have with your health care provider.   Document Released: 01/18/2004 Document Revised: 04/20/2013 Document Reviewed: 01/25/2013 Elsevier Interactive Patient Education [employer].  Etonogestrel implant What is this medicine? ETONOGESTREL (et oh noe JES trel) is a contraceptive (birth control) device. It is used to prevent pregnancy. It can be used for up to 3 years. This medicine may be used for other purposes; ask your health care provider or pharmacist if you have questions. What should I tell my health care provider before I take this medicine? They need to know if you have any of these conditions: -abnormal vaginal bleeding -blood vessel disease or blood clots -cancer of the breast, cervix, or liver -depression -diabetes -gallbladder disease -headaches -heart disease or recent heart attack -high blood pressure -high cholesterol -kidney disease -liver disease -renal disease -seizures -tobacco smoker -an unusual or allergic reaction to etonogestrel, other hormones, anesthetics or antiseptics, medicines, foods, dyes, or preservatives -pregnant or trying to get pregnant -breast-feeding How should I use this medicine? This device is inserted just under the skin on the inner side of your upper arm by a health care professional. Talk to your pediatrician regarding the use of this medicine in children. Special care may be needed. Overdosage: If you think you have taken too much of this medicine contact a poison control center or emergency room at once. NOTE: This medicine is only for you. Do  not share this medicine with others. What if I miss a dose? This does not apply. What may interact with this medicine? Do not take this medicine with any of the following medications: -amprenavir -bosentan -fosamprenavir This medicine may also interact with the following medications: -barbiturate medicines for inducing sleep or treating seizures -certain medicines for fungal infections like ketoconazole and itraconazole -griseofulvin -medicines to treat seizures like carbamazepine, felbamate, oxcarbazepine, phenytoin, topiramate -modafinil -phenylbutazone -rifampin -some medicines to treat HIV infection like atazanavir, indinavir, lopinavir, nelfinavir, tipranavir, ritonavir -St. John's wort This list may not describe all possible interactions. Give your health care provider a list of all the medicines, herbs, non-prescription drugs, or dietary supplements you use. Also tell them if you smoke, drink alcohol, or use illegal drugs. Some items may interact with your medicine. What should I watch for while using this medicine? This product does not protect you against HIV infection (AIDS) or other sexually transmitted diseases. You should be able to feel the implant by pressing your fingertips over the skin where it was inserted. Contact your doctor if you cannot feel the implant, and use a non-hormonal birth control method (such as condoms) until your doctor confirms that the implant is in place. If you feel that the implant may have broken or become bent while in your arm, contact your healthcare provider. What side effects may I notice from receiving this medicine? Side effects that you should report to your doctor or health care professional as soon as possible: -allergic reactions like skin rash, itching or hives, swelling of the face, lips, or tongue -breast lumps -changes in emotions or moods -depressed mood -heavy or prolonged menstrual bleeding -pain, irritation, swelling, or  bruising at the insertion site -scar at site of insertion -signs of infection at the insertion site such as fever, and skin redness, pain or discharge -signs of pregnancy -signs and symptoms of a blood clot such as breathing problems; changes in vision; chest pain; severe, sudden headache; pain, swelling, warmth in the leg; trouble speaking; sudden numbness or weakness of the face, arm or leg -signs and symptoms of liver injury like dark yellow or brown urine; general ill feeling or flu-like symptoms; light-colored stools; loss of appetite; nausea; right upper belly  pain; unusually weak or tired; yellowing of the eyes or skin -unusual vaginal bleeding, discharge -signs and symptoms of a stroke like changes in vision; confusion; trouble speaking or understanding; severe headaches; sudden numbness or weakness of the face, arm or leg; trouble walking; dizziness; loss of balance or coordination Side effects that usually do not require medical attention (Report these to your doctor or health care professional if they continue or are bothersome.): -acne -back pain -breast pain -changes in weight -dizziness -general ill feeling or flu-like symptoms -headache -irregular menstrual bleeding -nausea -sore throat -vaginal irritation or inflammation This list may not describe all possible side effects. Call your doctor for medical advice about side effects. You may report side effects to FDA at 1-800-FDA-1088. Where should I keep my medicine? This drug is given in a hospital or clinic and will not be stored at home. NOTE: This sheet is a summary. It may not cover all possible information. If you have questions about this medicine, talk to your doctor, pharmacist, or health care provider.    2016, Elsevier/Gold Standard. (2014-01-28 14:07:06)

## 2015-06-30 NOTE — Discharge Summary (Signed)
OB Discharge Summary     Patient Name: Mikayla Baker DOB: 03/05/1976 MRN: [id number]  Date of admission: 06/28/2015 Delivering MD: Adelina MingsSTANDARD, VENUS   Date of discharge: 06/30/2015  Admitting diagnosis: INDUCTION Intrauterine pregnancy: [id number]     Secondary diagnosis:  Principal Problem:   Normal vaginal delivery Active Problems:   Post term pregnancy, 41 weeks   HSV-2 seropositive   Spontaneous vaginal delivery  Additional problems: Infant in NICU     Discharge diagnosis: Term Pregnancy Delivered and Anemia                                                                                                Post partum procedures:none  Augmentation: Pitocin  Complications: None  Hospital course:  Induction of Labor With Vaginal Delivery   40 y.o. yo [phone] at 5915w0d was admitted to the hospital 06/28/2015 for induction of labor.  Indication for induction: Postdates.  Patient had an uncomplicated labor course as follows: Membrane Rupture Time/Date: 7:05 PM ,06/28/2015   Intrapartum Procedures: Episiotomy: None [1]                                         Lacerations:  1st degree [2]  Patient had delivery of a Viable infant.  Information for the patient's newborn:  Genevie CheshireMoore, Boy Donnia [[id number]][[id number]]  Delivery Method: Vag-Spont    06/28/2015  Details of delivery can be found in separate delivery note.  Patient had a routine postpartum course. Patient is discharged home 06/30/2015.   Physical exam  Filed Vitals:   06/29/15 1045 06/29/15 1902 06/29/15 2126 06/30/15 0555  BP: 115/72 103/60 112/64 115/69  Pulse: 73 78 73 78  Temp: 98.6 F (37 C) 98 F (36.7 C) 98.2 F (36.8 C) 98.3 F (36.8 C)  TempSrc: Oral Oral Oral Oral  Resp: 18 18 18 18   Height:      Weight:      SpO2: 100% 100% 100% 100%   General: alert and cooperative Lochia: appropriate Uterine Fundus: firm Incision: Healing well  DVT Evaluation: No evidence of DVT seen on physical exam. Negative Homan's sign. Labs: Lab  Results  Component Value Date   WBC 7.7 06/29/2015   HGB 9.7* 06/29/2015   HCT 28.4* 06/29/2015   MCV 88.5 06/29/2015   PLT 192 06/29/2015   No flowsheet data found.  Discharge instruction: per After Visit Summary and "Baby and Me Booklet". Breastfeeding, Anemia in pregnancy, Iron Rich Diet, Baby Blues and Nexplanon information/instructions included  After visit meds:    Medication List    STOP taking these medications        valACYclovir 500 MG tablet  Commonly known as:  VALTREX      TAKE these medications        acetaminophen 325 MG tablet  Commonly known as:  TYLENOL  Take 2 tablets (650 mg total) by mouth every 4 (four) hours as needed (for pain scale < 4).     ibuprofen 600 MG tablet  Commonly  known as:  ADVIL,MOTRIN  Take 1 tablet (600 mg total) by mouth every 6 (six) hours as needed.     prenatal multivitamin Tabs tablet  Take 1 tablet by mouth daily.        Diet: routine diet  Activity: Advance as tolerated. Pelvic rest for 6 weeks.   Outpatient follow ZO:XWRU to scheduled contraception visit for nexplanon in a couple weeks and Postpartum visit at 6 weeks Follow up Appt:No future appointments. Follow up Visit:No Follow-up on file.  Postpartum contraception: Nexplanon  Newborn Data: Live born female  Birth Weight: 7 lb 12.3 oz (3524 g) APGAR: 8, 2  Baby Feeding: Breast - pumping Disposition:NICU   06/30/2015 Alphonzo Severance, CNM

## 2015-06-30 NOTE — Progress Notes (Signed)
Pt verbalized understanding of DC instructions, medications, and when to seek medical attention. Pt was escorted to main entrance by NT. Pt SO will be driving her home. Pt had no questions at time of discharge. Ramond CraverHaley Monice Baker

## 2021-03-01 ENCOUNTER — Encounter (HOSPITAL_COMMUNITY): Payer: Self-pay

## 2021-03-01 ENCOUNTER — Emergency Department (HOSPITAL_COMMUNITY): Payer: Self-pay

## 2021-03-01 ENCOUNTER — Emergency Department (HOSPITAL_COMMUNITY)
Admission: EM | Admit: 2021-03-01 | Discharge: 2021-03-02 | Disposition: A | Discharge status: Home / Self Care | Payer: Self-pay | Attending: Emergency Medicine | Admitting: Emergency Medicine

## 2021-03-01 DIAGNOSIS — R202 Paresthesia of skin: Secondary | ICD-10-CM | POA: Insufficient documentation

## 2021-03-01 DIAGNOSIS — I10 Essential (primary) hypertension: Secondary | ICD-10-CM | POA: Insufficient documentation

## 2021-03-01 DIAGNOSIS — R531 Weakness: Secondary | ICD-10-CM | POA: Insufficient documentation

## 2021-03-01 DIAGNOSIS — R519 Headache, unspecified: Secondary | ICD-10-CM | POA: Insufficient documentation

## 2021-03-01 DIAGNOSIS — E039 Hypothyroidism, unspecified: Secondary | ICD-10-CM | POA: Insufficient documentation

## 2021-03-01 DIAGNOSIS — R002 Palpitations: Secondary | ICD-10-CM | POA: Insufficient documentation

## 2021-03-01 DIAGNOSIS — Z87891 Personal history of nicotine dependence: Secondary | ICD-10-CM | POA: Insufficient documentation

## 2021-03-01 LAB — BASIC METABOLIC PANEL
Anion gap: 9 (ref 5–15)
BUN: 11 mg/dL (ref 6–20)
CO2: 24 mmol/L (ref 22–32)
Calcium: 9 mg/dL (ref 8.9–10.3)
Chloride: 103 mmol/L (ref 98–111)
Creatinine, Ser: 0.74 mg/dL (ref 0.44–1.00)
GFR, Estimated: >60 mL/min (ref >60)
Glucose, Bld: 88 mg/dL (ref 70–99)
Potassium: 3.8 mmol/L (ref 3.5–5.1)
Sodium: 136 mmol/L (ref 135–145)

## 2021-03-01 LAB — TROPONIN I (HIGH SENSITIVITY)
Troponin I (High Sensitivity): <2 ng/L (ref <18)
Troponin I (High Sensitivity): 5 ng/L (ref <18)

## 2021-03-01 LAB — I-STAT BETA HCG BLOOD, ED (MC, WL, AP ONLY): I-stat hCG, quantitative: <5 m[IU]/mL (ref <5)

## 2021-03-01 LAB — CBC
HCT: 31.5 % — ABNORMAL LOW (ref 36.0–46.0)
Hemoglobin: 10 g/dL — ABNORMAL LOW (ref 12.0–15.0)
MCH: 25.3 pg — ABNORMAL LOW (ref 26.0–34.0)
MCHC: 31.7 g/dL (ref 30.0–36.0)
MCV: 79.5 fL — ABNORMAL LOW (ref 80.0–100.0)
Platelets: 324 10*3/uL (ref 150–400)
RBC: 3.96 MIL/uL (ref 3.87–5.11)
RDW: 13.8 % (ref 11.5–15.5)
WBC: 7 10*3/uL (ref 4.0–10.5)
nRBC: 0 % (ref 0.0–0.2)

## 2021-03-01 LAB — TSH: TSH: 1.245 u[IU]/mL (ref 0.350–4.500)

## 2021-03-01 MED ORDER — LORAZEPAM 2 MG/ML IJ SOLN
0.5000 mg | Freq: Once | INTRAMUSCULAR | Status: AC
  Administered 2021-03-01: 0.5 mg via INTRAVENOUS
  Filled 2021-03-01: qty 1

## 2021-03-01 NOTE — ED Notes (Signed)
Husband Mikayla Baker [phone] would like an update

## 2021-03-01 NOTE — Discharge Instructions (Signed)
You have been seen and discharged from the emergency department.  Follow-up with your primary provider for possible Holter monitor, reevaluation and further care. Take home medications as prescribed. If you have any worsening symptoms or further concerns for your health please return to an emergency department for further evaluation.

## 2021-03-01 NOTE — ED Provider Notes (Signed)
[facility] EMERGENCY DEPARTMENT Provider Note   CSN: [id number] Arrival date & time: 03/01/21  1630     History Chief Complaint  Patient presents with   Chest Pain   Hypertension    Mikayla Baker is a 45 y.o. female.  HPI  45 year old female with past medical history of hypothyroidism with pregnancy presents the emergency department concern for headache, left upper extremity weakness and palpitations.  Patient states today while at work she developed a gradual onset diffuse headache.  She then started having tingling in her left upper extremity that then progressed to heaviness in the left upper extremity.  Since this started this has been persistent, the headache is improved but not resolved.  Also at work she started feeling palpitations, like her heart was racing.  Her coworker took her vitals and said that her heart rate was in the 160s.  Patient states that she is had palpitations like this in the past but usually self resolved, this persisted.  She tried a vagal maneuver and felt she was going to pass out so she came here for evaluation.  On arrival the palpitations have resolved.  No active chest pain or shortness of breath.  No swelling of her lower extremities or symptoms of DVT.  She still has a low-grade headache with heaviness in the left upper extremity but denies any other acute neurologic symptom.  Past Medical History:  Diagnosis Date   Hyperthyroidism    thyroid ablation partial; worsens with pregnancy.    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 No  pertinent surgical history.   OB History     Gravida  4   Para  4   Term  4   Preterm  0   AB  0   Living  3      SAB  0   IAB  0   Ectopic  0   Multiple  0   Live Births  4           Family History  Problem Relation Age of Onset   Diabetes Maternal Grandmother    Hypertension Maternal Grandmother    Cancer Mother 8       Breast cancer   Cancer Father        lung cancer 22    Social History   Tobacco Use   Smoking status: Former    Types: E-cigarettes    Quit date: 09/27/2014    Years since quitting: 6.4   Smokeless tobacco: Never  Substance Use Topics   Alcohol use: No   Drug use: No    Home Medications Prior to Admission medications   Medication Sig Start Date End Date Taking? Authorizing Provider  acetaminophen (TYLENOL) 325 MG tablet Take 2 tablets (650 mg total) by mouth every 4 (four) hours as needed (for pain scale < 4). 06/30/15   Lavetta Nielsen, CNM  ibuprofen (ADVIL,MOTRIN) 600 MG tablet Take 1 tablet (600 mg total) by mouth every 6 (six) hours as needed. 06/30/15   Lavetta Nielsen, CNM  Prenatal Vit-Fe Fumarate-FA (PRENATAL MULTIVITAMIN) TABS Take 1 tablet by mouth  daily.    [provider]    Allergies    Patient has no known allergies.  Review of Systems   Review of Systems  Constitutional:  Negative for chills and fever.  HENT:  Negative for congestion.   Eyes:  Negative for visual disturbance.  Respiratory:  Negative for chest tightness and shortness of breath.   Cardiovascular:  Positive for palpitations. Negative for chest pain and leg swelling.  Gastrointestinal:  Negative for abdominal pain, diarrhea and vomiting.  Genitourinary:  Negative for dysuria.  Skin:  Negative for rash.  Neurological:  Positive for weakness and headaches. Negative for dizziness, facial asymmetry and speech difficulty.   Physical Exam Updated Vital Signs BP (!) 136/91   Pulse 76   Temp 99.3 F (37.4 C) (Oral)   Resp 14   SpO2 100%    Physical Exam Vitals and nursing note reviewed.  Constitutional:      Appearance: Normal appearance.  HENT:     Head: Normocephalic.     Mouth/Throat:     Mouth: Mucous membranes are moist.  Eyes:     Pupils: Pupils are equal, round, and reactive to light.  Cardiovascular:     Rate and Rhythm: Normal rate.  Pulmonary:     Effort: Pulmonary effort is normal. No respiratory distress.  Abdominal:     Palpations: Abdomen is soft.     Tenderness: There is no abdominal tenderness.  Musculoskeletal:     Right lower leg: No edema.     Left lower leg: No edema.  Skin:    General: Skin is warm.  Neurological:     General: No focal deficit present.     Mental Status: She is alert and oriented to person, place, and time. Mental status is at baseline.     Cranial Nerves: No cranial nerve deficit.     Motor: No weakness.  Psychiatric:        Mood and Affect: Mood normal.    ED Results / Procedures / Treatments   Labs (all labs ordered are listed, but only abnormal results are displayed) Labs Reviewed  CBC - Abnormal; Notable for the following components:      Result Value   Hemoglobin 10.0 (*)    HCT 31.5 (*)    MCV 79.5 (*)    MCH 25.3 (*)    All other components within normal limits  BASIC METABOLIC PANEL  TSH  I-STAT BETA HCG BLOOD, ED ([facility], [facility], [facility] ONLY)  TROPONIN I (HIGH SENSITIVITY)  TROPONIN I (HIGH SENSITIVITY)    EKG None  Radiology DG Chest 2 View  Result Date: 03/01/2021 CLINICAL DATA:  Palpitations and left arm tingling. EXAM: CHEST - 2 VIEW COMPARISON:  None. FINDINGS: The heart size and mediastinal contours are within normal limits. Both lungs are clear. The visualized skeletal structures are unremarkable. IMPRESSION: No active cardiopulmonary disease. Electronically Signed   By: Virgina Norfolk M.D.   On: 03/01/2021 18:46   MR Brain Wo Contrast (neuro protocol)  Result Date: 03/01/2021 CLINICAL DATA:  Acute neurologic deficit EXAM: MRI HEAD WITHOUT  CONTRAST TECHNIQUE: Multiplanar, multiecho pulse sequences of the brain and surrounding structures were obtained without intravenous contrast. COMPARISON:  None. FINDINGS: Brain: No acute infarct, mass effect or extra-axial collection. No acute or chronic hemorrhage. Normal white matter signal, parenchymal volume and CSF spaces. The midline structures are normal. Vascular: Major flow voids are preserved. Skull and upper cervical spine: Normal calvarium and skull base. Visualized upper cervical spine and  soft tissues are normal. Sinuses/Orbits:No paranasal sinus fluid levels or advanced mucosal thickening. No mastoid or middle ear effusion. Normal orbits. IMPRESSION: Normal brain MRI. Electronically Signed   By: Deatra Robinson M.D.   On: 03/01/2021 23:11    Procedures Procedures   Medications Ordered in ED Medications  LORazepam (ATIVAN) injection 0.5 mg (0.5 mg Intravenous Given 03/01/21 2145)    ED Course  I have reviewed the triage vital signs and the nursing notes.  Pertinent labs & imaging results that were available during my care of the patient were reviewed by me and considered in my medical decision making (see chart for details).    MDM Rules/Calculators/A&P                           45 year old female presents the emergency department with headache, left upper extremity heaviness and an episode of palpitations.  Vital signs are stable on arrival.  Headache is mild.  No acute neurologic symptoms.  Blood work is baseline. Cardiac work-up is reassuring, no signs of ischemia, troponin is negative x2.  MRI of the brain shows no findings of CVA.  On reevaluation patient feels well.  She is been in normal sinus rhythm her entire time here, no findings of arrhythmia.  She is otherwise low risk.  Plan for outpatient follow-up, possible Holter monitoring.  Patient at this time appears safe and stable for discharge and will be treated as an outpatient.  Discharge plan and strict return to ED  precautions discussed, patient verbalizes understanding and agreement.  Final Clinical Impression(s) / ED Diagnoses Final diagnoses:  None    Rx / DC Orders ED Discharge Orders     None        Rozelle Logan, DO 03/01/21 2342

## 2021-03-01 NOTE — ED Triage Notes (Signed)
Pt c/o L arm intermittent tingling, weakness began approx 1500, palpitation approx 1600. Pt attempted vagal maneuver, states she felt she was going to pass out. Also endorses temporal HA "all day." Pt endorses hx palpitations, states this is "longest, strongest episode."  178/110 HR 167

## 2021-03-01 NOTE — ED Provider Notes (Signed)
Emergency Medicine Provider Triage Evaluation Note  Mikayla Baker , a 45 y.o. female  was evaluated in triage.  Pt with history of hyperthyroidism complains of palpitations.  Patient states that she was at work, when she began to feel weak around 3 PM, and had palpitations starting about 4 PM.  She states that she attempted vagal maneuvers, and felt like she was going to pass out.  They had checked her vital signs at work, and her heart rate was in the 160s and blood pressure with systolics into the 170s.  She notes that she has had a headache with some intermittent blurry vision for most of the day.  She feels as though her left arm is tingly and feels weaker compared to her right.  Does have a history of palpitations associated with hyperthyroidism in pregnancy, s/p thyroid ablation.  Review of Systems  Positive: Palpitations, headache, weakness, vision changes Negative: Chest pain, shortness of breath, abdominal pain, nausea, vomiting  Physical Exam  BP (!) 142/95 (BP Location: Left Arm)   Pulse (!) 102   Temp 99.3 F (37.4 C) (Oral)   Resp 18   SpO2 100%  Gen:   Awake, no distress   Resp:  Normal effort  MSK:   Moves extremities without difficulty  Other:    Medical Decision Making  Medically screening exam initiated at 6:12 PM.  Appropriate orders placed.  Juel Burrow was informed that the remainder of the evaluation will be completed by another provider, this initial triage assessment does not replace that evaluation, and the importance of remaining in the ED until their evaluation is complete.     Jeanella Flattery 03/01/21 1815    Terald Sleeper, MD 03/01/21 [id number]

## 2021-03-01 NOTE — ED Notes (Signed)
Patient verbalizes understanding of discharge instructions. Follow-up care reviewed. Opportunity for questioning and answers were provided. Armband removed by staff, pt discharged from ED ambulatory.  

## 2022-05-27 ENCOUNTER — Encounter: Payer: Medicaid Other | Admitting: Obstetrics & Gynecology

## 2022-07-03 ENCOUNTER — Other Ambulatory Visit (HOSPITAL_COMMUNITY)
Admission: RE | Admit: 2022-07-03 | Discharge: 2022-07-03 | Disposition: A | Discharge status: Home / Self Care | Payer: Commercial Managed Care - PPO | Source: Ambulatory Visit | Attending: Family Medicine | Admitting: Family Medicine

## 2022-07-03 ENCOUNTER — Ambulatory Visit (INDEPENDENT_AMBULATORY_CARE_PROVIDER_SITE_OTHER): Payer: Commercial Managed Care - PPO | Admitting: Family Medicine

## 2022-07-03 ENCOUNTER — Encounter: Payer: Self-pay | Admitting: Family Medicine

## 2022-07-03 VITALS — BP 139/79 | HR 120 | Ht 69.0 in | Wt 140.6 lb

## 2022-07-03 DIAGNOSIS — Z01419 Encounter for gynecological examination (general) (routine) without abnormal findings: Secondary | ICD-10-CM | POA: Insufficient documentation

## 2022-07-03 DIAGNOSIS — Z1211 Encounter for screening for malignant neoplasm of colon: Secondary | ICD-10-CM | POA: Insufficient documentation

## 2022-07-03 LAB — CERVICOVAGINAL ANCILLARY ONLY
Chlamydia: NEGATIVE
Comment: NEGATIVE
Comment: NEGATIVE
Comment: NORMAL
Neisseria Gonorrhea: NEGATIVE
Trichomonas: NEGATIVE

## 2022-07-03 NOTE — Assessment & Plan Note (Signed)
Pap smear collected today.  Bimanual exam showing no palpable fibroids but patient reports that she does have history of fibroids.  Also having issues with extremely heavy menses and would like IUD.  Patient scheduled for IUD appointment.  Routine STD screening collected and will call patient with results of any abnormalities or send MyChart message if everything is within normal limits.

## 2022-07-03 NOTE — Progress Notes (Signed)
Pt presents for annual exam. Pt reports having abnormal bleeding. She states she will bleed for 2-3 weeks at a time, and it is heavy. Pt has hx of fibroids. Last PAP was 7 years ago. Pt has not had mammogram. Pt has Nexplanon that was placed in 2017 and would like it removed. Pt would like OCP for birth control. No other concerns at this time.

## 2022-07-03 NOTE — Progress Notes (Signed)
    SUBJECTIVE:   Chief compliant/HPI: annual examination  Mikayla Baker is a 47 y.o. who presents today for an annual exam.   Review of systems form notable for extremely heavy menses.  She reports she has to use tampons as well as pads at the same time.  Reports that the abnormal bleeding started when she got her Nexplanon placed 7 years ago.Marland Kitchen   Updated history tabs and problem list.   OBJECTIVE:   BP 139/79   Pulse (!) 120   Ht '5\' 9"'$  (1.753 m)   Wt 140 lb 9.6 oz (63.8 kg)   LMP 07/03/2022 (Exact Date)   BMI 20.76 kg/m   General: Pleasant, well-appearing 47 year old female Cardiac: Regular rate Respiratory normal for breathing, speaking in full sentences Abdomen: Soft, nontender, no organomegaly GU: Normal external vaginal tissue, Pap smear collected without difficulty, no abnormal discharge, no cervical motion tenderness, uterus not abnormally enlarged on bimanual exam  ASSESSMENT/PLAN:   Women's annual routine gynecological examination Pap smear collected today.  Bimanual exam showing no palpable fibroids but patient reports that she does have history of fibroids.  Also having issues with extremely heavy menses and would like IUD.  Patient scheduled for IUD appointment.  Routine STD screening collected and will call patient with results of any abnormalities or send MyChart message if everything is within normal limits.    Annual Examination  See AVS for age appropriate recommendations.   Blood pressure reviewed and at goal.  The patient currently uses expired Nexplanon for contraception.  Nexplanon removed at today's visit.  Patient planning on IUD placement and appointment scheduled for this.   Considered the following items based upon USPSTF recommendations: HIV testing: ordered GC/CT  collected today Hepatitis C, hepatitis B, RPR were planning on being collected today but were not.  She will need to get these at a future visit.  Mammogram ordered Cervical  cancer screening:  Due for Pap, Pap collected today.  Follow up in 1  year or sooner if indicated.    Concepcion Living, MD OB Fellow

## 2022-07-04 LAB — HIV ANTIBODY (ROUTINE TESTING W REFLEX): HIV Screen 4th Generation wRfx: NONREACTIVE

## 2022-07-09 LAB — CYTOLOGY - PAP
Comment: NEGATIVE
Diagnosis: NEGATIVE
Diagnosis: REACTIVE
High risk HPV: NEGATIVE

## 2022-08-09 ENCOUNTER — Ambulatory Visit (INDEPENDENT_AMBULATORY_CARE_PROVIDER_SITE_OTHER): Payer: Commercial Managed Care - PPO | Admitting: Obstetrics and Gynecology

## 2022-08-09 VITALS — BP 124/82 | HR 78 | Ht 69.0 in | Wt 145.0 lb

## 2022-08-09 DIAGNOSIS — Z30430 Encounter for insertion of intrauterine contraceptive device: Secondary | ICD-10-CM | POA: Diagnosis not present

## 2022-08-09 DIAGNOSIS — Z3202 Encounter for pregnancy test, result negative: Secondary | ICD-10-CM

## 2022-08-09 LAB — POCT URINE PREGNANCY: Preg Test, Ur: NEGATIVE

## 2022-08-09 MED ORDER — LEVONORGESTREL 20 MCG/DAY IU IUD
1.0000 | INTRAUTERINE_SYSTEM | Freq: Once | INTRAUTERINE | Status: AC
  Administered 2022-08-09: 1 via INTRAUTERINE

## 2022-08-09 NOTE — Progress Notes (Signed)
Pt on office for IUD insertion.   Pt states LMP 07/30/22, no recent intercourse.

## 2022-08-09 NOTE — Progress Notes (Signed)
    GYNECOLOGY OFFICE PROCEDURE NOTE  Mikayla Baker is a 47 y.o. [phone] here for Liletta IUD insertion. No GYN concerns.  Last pap smear was on 07/03/22 and was normal.  IUD Insertion Procedure Note Patient identified, informed consent performed, consent signed.   Discussed risks of irregular bleeding, cramping, infection, malpositioning or misplacement of the IUD outside the uterus which may require further procedure such as laparoscopy. Also discussed >99% contraception efficacy, increased risk of ectopic pregnancy with failure of method.  Time out was performed.  Urine pregnancy test negative.  Speculum placed in the vagina.  Cervix visualized.  Cleaned with Betadine x 2.  Grasped anteriorly with a single tooth tenaculum.  Uterus sounded to 9 cm.  Mirena IUD placed per manufacturer's recommendations.  Strings trimmed to 3 cm. Tenaculum was removed, good hemostasis noted.  Patient tolerated procedure well.   Patient was given post-procedure instructions.  She was advised to have backup contraception for one week.  Patient was also asked to check IUD strings periodically and follow up in 4 weeks for IUD check.   Mariel Aloe, MD, FACOG Obstetrician & Gynecologist, [facility], [facility]

## 2022-08-14 ENCOUNTER — Ambulatory Visit
Admission: RE | Admit: 2022-08-14 | Discharge: 2022-08-14 | Disposition: A | Discharge status: Home / Self Care | Payer: Commercial Managed Care - PPO | Source: Ambulatory Visit | Attending: Family Medicine | Admitting: Family Medicine

## 2022-08-14 DIAGNOSIS — Z01419 Encounter for gynecological examination (general) (routine) without abnormal findings: Secondary | ICD-10-CM

## 2022-08-14 DIAGNOSIS — Z1231 Encounter for screening mammogram for malignant neoplasm of breast: Secondary | ICD-10-CM | POA: Diagnosis not present

## 2022-08-19 ENCOUNTER — Ambulatory Visit: Payer: Commercial Managed Care - PPO | Admitting: Family Medicine

## 2022-09-06 ENCOUNTER — Ambulatory Visit: Payer: Commercial Managed Care - PPO | Admitting: Obstetrics and Gynecology

## 2022-10-03 ENCOUNTER — Other Ambulatory Visit (INDEPENDENT_AMBULATORY_CARE_PROVIDER_SITE_OTHER): Payer: Commercial Managed Care - PPO

## 2022-10-03 ENCOUNTER — Ambulatory Visit (INDEPENDENT_AMBULATORY_CARE_PROVIDER_SITE_OTHER): Payer: Commercial Managed Care - PPO | Admitting: Family Medicine

## 2022-10-03 ENCOUNTER — Encounter: Payer: Self-pay | Admitting: Family Medicine

## 2022-10-03 VITALS — BP 126/82 | HR 96 | Temp 98.4°F | Ht 69.0 in | Wt 147.2 lb

## 2022-10-03 DIAGNOSIS — D509 Iron deficiency anemia, unspecified: Secondary | ICD-10-CM

## 2022-10-03 DIAGNOSIS — E611 Iron deficiency: Secondary | ICD-10-CM

## 2022-10-03 DIAGNOSIS — Z8639 Personal history of other endocrine, nutritional and metabolic disease: Secondary | ICD-10-CM

## 2022-10-03 DIAGNOSIS — Z1211 Encounter for screening for malignant neoplasm of colon: Secondary | ICD-10-CM

## 2022-10-03 DIAGNOSIS — Z0000 Encounter for general adult medical examination without abnormal findings: Secondary | ICD-10-CM

## 2022-10-03 DIAGNOSIS — R0681 Apnea, not elsewhere classified: Secondary | ICD-10-CM | POA: Diagnosis not present

## 2022-10-03 DIAGNOSIS — Z13220 Encounter for screening for lipoid disorders: Secondary | ICD-10-CM | POA: Diagnosis not present

## 2022-10-03 LAB — URINALYSIS, ROUTINE W REFLEX MICROSCOPIC
Bilirubin Urine: NEGATIVE
Ketones, ur: NEGATIVE
Leukocytes,Ua: NEGATIVE
Nitrite: NEGATIVE
Specific Gravity, Urine: 1.025 (ref 1.000–1.030)
Total Protein, Urine: NEGATIVE
Urine Glucose: NEGATIVE
Urobilinogen, UA: 1 (ref 0.0–1.0)
WBC, UA: NONE SEEN (ref 0–?)
pH: 6 (ref 5.0–8.0)

## 2022-10-03 LAB — COMPREHENSIVE METABOLIC PANEL
ALT: 10 U/L (ref 0–35)
AST: 13 U/L (ref 0–37)
Albumin: 4.1 g/dL (ref 3.5–5.2)
Alkaline Phosphatase: 46 U/L (ref 39–117)
BUN: 13 mg/dL (ref 6–23)
CO2: 27 mEq/L (ref 19–32)
Calcium: 8.8 mg/dL (ref 8.4–10.5)
Chloride: 103 mEq/L (ref 96–112)
Creatinine, Ser: 0.76 mg/dL (ref 0.40–1.20)
GFR: 94.79 mL/min (ref >60.00)
Glucose, Bld: 89 mg/dL (ref 70–99)
Potassium: 4.3 mEq/L (ref 3.5–5.1)
Sodium: 138 mEq/L (ref 135–145)
Total Bilirubin: 0.3 mg/dL (ref 0.2–1.2)
Total Protein: 7.3 g/dL (ref 6.0–8.3)

## 2022-10-03 LAB — LIPID PANEL
Cholesterol: 150 mg/dL (ref 0–200)
HDL: 54.3 mg/dL (ref >39.00)
LDL Cholesterol: 87 mg/dL (ref 0–99)
NonHDL: 95.4
Total CHOL/HDL Ratio: 3
Triglycerides: 44 mg/dL (ref 0.0–149.0)
VLDL: 8.8 mg/dL (ref 0.0–40.0)

## 2022-10-03 LAB — CBC
HCT: 33.9 % — ABNORMAL LOW (ref 36.0–46.0)
Hemoglobin: 10.9 g/dL — ABNORMAL LOW (ref 12.0–15.0)
MCHC: 32.2 g/dL (ref 30.0–36.0)
MCV: 80.8 fl (ref 78.0–100.0)
Platelets: 332 10*3/uL (ref 150.0–400.0)
RBC: 4.2 Mil/uL (ref 3.87–5.11)
RDW: 15.5 % (ref 11.5–15.5)
WBC: 5.8 10*3/uL (ref 4.0–10.5)

## 2022-10-03 LAB — TSH: TSH: 0.72 u[IU]/mL (ref 0.35–5.50)

## 2022-10-03 LAB — T3, FREE: T3, Free: 3.4 pg/mL (ref 2.3–4.2)

## 2022-10-03 NOTE — Addendum Note (Signed)
Addended by: Lake Bells on: 10/03/2022 12:02 PM   Modules accepted: Orders

## 2022-10-03 NOTE — Progress Notes (Addendum)
New Patient Office Visit  Subjective    Patient ID: Mikayla Baker, female    DOB: 06/27/1975  Age: 47 y.o. MRN: [id number]  CC:  Chief Complaint  Patient presents with   Establish Care    NP/establish care concerns about sleep apnea would like referral for sleep study. Patient fasting.     HPI Mikayla Baker presents to establish care. Encounter Diagnoses  Name Primary?   Healthcare maintenance Yes   Screening for colon cancer    Apnea    Personal history of hyperthyroidism    Microcytic anemia    Iron deficiency    For establishment care and health check.  Status post recent GYN exam.  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  She lives at home with her husband and 2 children.  She lost her 62 year old son back in 2018.  The anniversary of his death death was the June 20, 2023 of last month.  This is a hard time of year for her.  This did not affect answers on the PHQ-9.  She is quite active at work but has no regular exercise program.  She does not have regular dental care.  She is concerned about the possibility of apnea.  When she sleeps on her back she wakes up gasping throughout the night.  She does not feel rested in the morning.  Her husband says she snores loudly.  History of hypothyroidism.  She is on as needed follow-up with endocrinology.  Her heart rate continues to be elevated up into the 90s.  Outpatient Encounter Medications as of 10/03/2022  Medication Sig   Iron, Ferrous Sulfate, 325 (65 Fe) MG TABS Take 325 mg by mouth daily.   [DISCONTINUED] acetaminophen (TYLENOL) 325 MG tablet Take 2 tablets (650 mg total) by mouth every 4 (four) hours as needed (for pain scale < 4).   [DISCONTINUED] ibuprofen (ADVIL,MOTRIN) 600 MG tablet Take 1 tablet (600 mg total) by mouth every 6 (six) hours as needed.   [DISCONTINUED] Prenatal Vit-Fe Fumarate-FA (PRENATAL MULTIVITAMIN) TABS Take 1 tablet by mouth daily.   No facility-administered encounter medications on file as of  10/03/2022.    Past Medical History:  Diagnosis Date   Hyperthyroidism    thyroid ablation partial; worsens with pregnancy.    History reviewed. No pertinent surgical history.  Family History  Problem Relation Age of Onset   Breast cancer Mother    Cancer Mother 31       Breast cancer   Cancer Father        lung cancer 13   Diabetes Maternal Grandmother    Hypertension Maternal Grandmother     Social History   Socioeconomic History   Marital status: Single    Spouse name: Not on file   Number of children: Not on file   Years of education: Not on file   Highest education level: Not on file  Occupational History   Not on file  Tobacco Use   Smoking status: Former    Types: E-cigarettes    Quit date: 09/27/2014    Years since quitting: 8.0   Smokeless tobacco: Never  Vaping Use   Vaping Use: Former  Substance and Sexual Activity   Alcohol use: Yes    Comment: causually   Drug use: No   Sexual activity: Yes  Other Topics Concern   Not on file  Social History Narrative   Marital status: single      Children: 3 children (10,  17); 1 son died at age 33 from 25.        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      Tobacco:  None      Alcohol: beer on weekends; no DWIs.       Drugs: none      Exercise:  none   Social Determinants of Health   Financial Resource Strain: Not on file  Food Insecurity: Not on file  Transportation Needs: Not on file  Physical Activity: Not on file  Stress: Not on file  Social Connections: Not on file  Intimate Partner Violence: Not on file    Review of Systems  Constitutional: Negative.   HENT: Negative.    Eyes:  Negative for blurred vision, discharge and redness.  Respiratory: Negative.    Cardiovascular: Negative.   Gastrointestinal:  Negative for abdominal pain.  Genitourinary: Negative.   Musculoskeletal: Negative.  Negative for myalgias.  Skin:  Negative for  rash.  Neurological:  Negative for tingling, loss of consciousness and weakness.  Endo/Heme/Allergies:  Negative for polydipsia.      10/03/2022    9:39 AM 10/03/2022    9:06 AM 07/03/2022   10:31 AM  Depression screen PHQ 2/9  Decreased Interest 1 0 0  Down, Depressed, Hopeless 1 1 0  PHQ - 2 Score 2 1 0  Altered sleeping 1  0  Tired, decreased energy 1  0  Change in appetite 0  0  Feeling bad or failure about yourself  0  0  Trouble concentrating 0  0  Moving slowly or fidgety/restless 0  0  Suicidal thoughts 0  0  PHQ-9 Score 4  0  Difficult doing work/chores Not difficult at all           Objective    BP 126/82 (BP Location: Left Arm, Patient Position: Sitting, Cuff Size: Normal)   Pulse 96   Temp 98.4 F (36.9 C) (Temporal)   Ht 5\' 9"  (1.753 m)   Wt 147 lb 3.2 oz (66.8 kg)   LMP 09/22/2022 (Approximate)   SpO2 98%   BMI 21.74 kg/m   Physical Exam Constitutional:      General: She is not in acute distress.    Appearance: Normal appearance. She is not ill-appearing, toxic-appearing or diaphoretic.  HENT:     Head: Normocephalic and atraumatic.     Right Ear: Tympanic membrane, ear canal and external ear normal.     Left Ear: Tympanic membrane, ear canal and external ear normal.     Mouth/Throat:     Mouth: Mucous membranes are moist.     Pharynx: Oropharynx is clear. No oropharyngeal exudate or posterior oropharyngeal erythema.   Eyes:     General: No scleral icterus.       Right eye: No discharge.        Left eye: No discharge.     Extraocular Movements: Extraocular movements intact.     Conjunctiva/sclera: Conjunctivae normal.     Pupils: Pupils are equal, round, and reactive to light.  Neck:     Thyroid: No thyroid mass, thyromegaly or thyroid tenderness.  Cardiovascular:     Rate and Rhythm: Normal rate and regular rhythm.  Pulmonary:     Effort: Pulmonary effort is normal. No respiratory distress.     Breath sounds: Normal breath sounds. No  wheezing, rhonchi or rales.  Abdominal:     General:  Bowel sounds are normal.     Tenderness: There is no abdominal tenderness. There is no guarding.  Musculoskeletal:     Cervical back: No rigidity or tenderness.  Skin:    General: Skin is warm and dry.  Neurological:     Mental Status: She is alert and oriented to person, place, and time.  Psychiatric:        Mood and Affect: Mood normal.        Behavior: Behavior normal.         Assessment & Plan:   Healthcare maintenance -     CBC -     Comprehensive metabolic panel -     Lipid panel -     Urinalysis, Routine w reflex microscopic  Screening for colon cancer -     Ambulatory referral to Gastroenterology  Apnea -     Ambulatory referral to Pulmonology  Personal history of hyperthyroidism -     TSH  Microcytic anemia -     Iron, TIBC and Ferritin Panel; Future -     Iron (Ferrous Sulfate); Take 325 mg by mouth daily.  Dispense: 90 tablet; Refill: 3  Iron deficiency -     Iron (Ferrous Sulfate); Take 325 mg by mouth daily.  Dispense: 90 tablet; Refill: 3     Return in about 3 months (around 01/03/2023).  Information was given on health maintenance and disease prevention.  Encouraged her to exercise for 30 minutes walking other than at work.  Encouraged her to go for regular dental care.  Agrees to go for consultations for sleep study and colonoscopy.  Information was given on sleep apnea.  Mliss Sax, MD

## 2022-10-04 DIAGNOSIS — Z0000 Encounter for general adult medical examination without abnormal findings: Secondary | ICD-10-CM | POA: Insufficient documentation

## 2022-10-04 DIAGNOSIS — D509 Iron deficiency anemia, unspecified: Secondary | ICD-10-CM | POA: Insufficient documentation

## 2022-10-04 DIAGNOSIS — Z8639 Personal history of other endocrine, nutritional and metabolic disease: Secondary | ICD-10-CM | POA: Insufficient documentation

## 2022-10-04 DIAGNOSIS — R0681 Apnea, not elsewhere classified: Secondary | ICD-10-CM | POA: Insufficient documentation

## 2022-10-04 NOTE — Addendum Note (Signed)
Addended by: Nadene Rubins A on: 10/04/2022 01:01 PM   Modules accepted: Orders

## 2022-10-08 NOTE — Addendum Note (Signed)
Addended by: Lake Bells on: 10/08/2022 09:47 AM   Modules accepted: Orders

## 2022-10-09 ENCOUNTER — Other Ambulatory Visit: Payer: Commercial Managed Care - PPO

## 2022-10-09 DIAGNOSIS — D509 Iron deficiency anemia, unspecified: Secondary | ICD-10-CM | POA: Diagnosis not present

## 2022-10-10 LAB — IRON,TIBC AND FERRITIN PANEL
%SAT: 13 % (calc) — ABNORMAL LOW (ref 16–45)
Ferritin: 7 ng/mL — ABNORMAL LOW (ref 16–232)
Iron: 60 ug/dL (ref 40–190)
TIBC: 449 mcg/dL (calc) (ref 250–450)

## 2022-10-10 MED ORDER — IRON (FERROUS SULFATE) 325 (65 FE) MG PO TABS
325.0000 mg | ORAL_TABLET | Freq: Every day | ORAL | 3 refills | Status: AC
Start: 2022-10-10 — End: ?

## 2022-10-10 NOTE — Addendum Note (Signed)
Addended by: Andrez Grime on: 10/10/2022 11:56 AM   Modules accepted: Orders

## 2022-11-22 ENCOUNTER — Institutional Professional Consult (permissible substitution): Payer: Commercial Managed Care - PPO | Admitting: Primary Care

## 2022-12-13 ENCOUNTER — Ambulatory Visit (HOSPITAL_COMMUNITY): Payer: Commercial Managed Care - PPO

## 2022-12-24 ENCOUNTER — Telehealth: Payer: Self-pay

## 2022-12-24 ENCOUNTER — Encounter: Payer: Self-pay | Admitting: Adult Health

## 2022-12-24 ENCOUNTER — Ambulatory Visit: Payer: Commercial Managed Care - PPO | Admitting: Adult Health

## 2022-12-24 VITALS — BP 124/70 | HR 94 | Ht 69.0 in | Wt 140.0 lb

## 2022-12-24 DIAGNOSIS — R0683 Snoring: Secondary | ICD-10-CM | POA: Insufficient documentation

## 2022-12-24 NOTE — Assessment & Plan Note (Signed)
Snoring, witness apneic events , gasping for air , daytime sleepiness and restless sleep -concerning for sleep apnea.  Set up for home sleep study .  Patient education on OSA  - discussed how weight can impact sleep and risk for sleep disordered breathing - discussed options to assist with weight loss: combination of diet modification, cardiovascular and strength training exercises   - had an extensive discussion regarding the adverse health consequences related to untreated sleep disordered breathing - specifically discussed the risks for hypertension, coronary artery disease, cardiac dysrhythmias, cerebrovascular disease, and diabetes - lifestyle modification discussed   - discussed how sleep disruption can increase risk of accidents, particularly when driving - safe driving practices were discussed   Plan  . Patient Instructions  Set up for home sleep study  Healthy sleep regimen  Do not drive if sleepy  Follow up in 6 weeks to discuss results and treatment plan .

## 2022-12-24 NOTE — Telephone Encounter (Signed)
Called patient at [phone] & [phone] to try and reach patient for pre visit appointment.  Left message stating that they needed to call back and reschedule with the pre visit nurse by 5pm today or the colonoscopy on 9/9 would be cancelled

## 2022-12-24 NOTE — Telephone Encounter (Signed)
Patient did not call back. NO Show PV.  PV and colonoscopy have been cancelled and letter sent in [facility] and in the mail.

## 2022-12-24 NOTE — Patient Instructions (Signed)
Set up for home sleep study  Healthy sleep regimen.  Do not drive if sleepy  Follow up in 6 weeks to discuss results and treatment plan  

## 2022-12-24 NOTE — Progress Notes (Signed)
@Patient  ID: Mikayla Baker, female    DOB: [birth year], 47 y.o.   MRN: [id number]  Chief Complaint  Patient presents with   Consult    Referring provider: Mliss Sax  HPI: 47 year old female seen for sleep consult December 24, 2022 for snoring, witnessed apneic events, restless sleep and daytime sleepiness RN in ER at [facility]  TEST/EVENTS :   12/24/2022 Sleep consult  Patient presents for sleep consult.  Kindly referred by Dr. Doreene Burke.  Patient complains of snoring, restless sleep, witnessed apneic events, daytime sleepiness.  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  Says she works 3 PM to 3 AM shift.  Typically goes to bed about 4 AM.  Takes a couple hours to go to sleep.  Is up multiple times throughout the night.  Gets up at 1 PM.  Has never had a sleep study before.  Has no symptoms suspicious for cataplexy or sleep paralysis.  Does not take any sleep aids. Previously tried melatonin with some help.   No history of congestive heart failure or stroke.  Epworth score is 15 out of 24.  Gets sleepy if she sits down to watch TV, rest, evening hours and after eating lunch. Has always worked 2nd or 3 rd shift . Works 3 shifts a week, stays on same schedule when she is off. Has gasping in her sleep if she is on her back.  No removeable dental work.   Social history patient is married.  Lives at home with her husband and children.  She is a Designer, jewellery.  Never smoker.  Social alcohol.  No drug use.  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 No pertinent surgical history.   No Known Allergies  Immunization History  Administered Date(s) Administered   Influenza,inj,Quad PF,6+ Mos 06/29/2015, 03/24/2020   PPD Test 01/11/2019, 03/24/2020, 04/04/2021, 12/12/2021   Tdap 06/29/2015    Past Medical History:  Diagnosis Date   Hyperthyroidism    thyroid ablation partial; worsens with pregnancy.    Tobacco History: Social History   Tobacco Use  Smoking Status Former   Types: E-cigarettes   Quit date: 09/27/2014   Years since quitting: 8.2  Smokeless Tobacco Never   Counseling given: Not Answered   Outpatient Medications Prior to Visit  Medication Sig Dispense Refill   Iron, Ferrous Sulfate, 325 (65 Fe) MG TABS Take 325 mg by mouth daily. 90 tablet 3   No facility-administered medications prior to visit.     Review of Systems:   Constitutional:   No  weight loss, night sweats,  Fevers, chills, + fatigue, or  lassitude.  HEENT:   No headaches,  Difficulty swallowing,  Tooth/dental problems, or  Sore throat,                No sneezing, itching, ear ache, nasal congestion, post nasal drip,   CV:  No chest  pain,  Orthopnea, PND, swelling in lower extremities, anasarca, dizziness, palpitations, syncope.   GI  No heartburn, indigestion, abdominal pain, nausea, vomiting, diarrhea, change in bowel habits, loss of appetite, bloody stools.   Resp: No shortness of breath with exertion or at rest.  No excess mucus, no productive cough,  No non-productive cough,  No coughing up of blood.  No change in color of mucus.  No wheezing.  No chest wall deformity  Skin: no rash or lesions.  GU: no dysuria, change in color of urine, no urgency or frequency.  No flank pain, no hematuria   MS:  No joint pain or swelling.  No  decreased range of motion.  No back pain.    Physical Exam  BP 124/70 (BP Location: Left Arm, Patient Position: Sitting, Cuff Size: Normal)   Pulse 94   Ht 5\' 9"  (1.753 m)   Wt 140 lb (63.5 kg)   SpO2 98%   BMI 20.67 kg/m   GEN: A/Ox3; pleasant , NAD, well nourished    HEENT:  Assumption/AT,  NOSE-clear, THROAT-clear, no lesions, no postnasal drip or exudate noted. Class 3 MP airway   NECK:  Supple w/ fair ROM; no JVD; normal carotid impulses w/o bruits; no thyromegaly or nodules palpated; no lymphadenopathy.    RESP  Clear  P & A; w/o, wheezes/ rales/ or rhonchi. no accessory muscle use, no dullness to percussion  CARD:  RRR, no m/r/g, no peripheral edema, pulses intact, no cyanosis or clubbing.  GI:   Soft & nt; nml bowel sounds; no organomegaly or masses detected.   Musco: Warm bil, no deformities or joint swelling noted.   Neuro: alert, no focal deficits noted.    Skin: Warm, no lesions or rashes    Lab Results:  CBC   BNP No results found for: "BNP"  ProBNP No results found for: "PROBNP"  Imaging: No results found.  Administration History     None           No data to display          No results found for: "NITRICOXIDE"      Assessment & Plan:   Snoring Snoring, witness apneic events , gasping for air , daytime sleepiness and restless sleep -concerning for sleep apnea.  Set up for home sleep study .  Patient education on OSA  - discussed how weight can impact sleep and risk for sleep disordered breathing - discussed options to assist with weight loss: combination of diet modification, cardiovascular and strength training exercises   - had an extensive discussion regarding the adverse health consequences related to untreated sleep disordered breathing - specifically discussed the risks for hypertension, coronary artery disease, cardiac dysrhythmias, cerebrovascular disease, and diabetes - lifestyle modification discussed   - discussed how  sleep disruption can increase risk of accidents, particularly when driving - safe driving practices were discussed   Plan  . Patient Instructions  Set up for home sleep study  Healthy sleep regimen  Do not drive if sleepy  Follow up in 6 weeks to discuss results and treatment plan .        Rubye Oaks, NP 12/24/2022

## 2022-12-24 NOTE — Progress Notes (Signed)
Reviewed and agree with assessment/plan.   Coralyn Helling, MD [facility] Pulmonary[facility] 12/24/2022, 1:38 PM Pager:  [phone]

## 2023-01-06 ENCOUNTER — Encounter: Payer: Commercial Managed Care - PPO | Admitting: Gastroenterology

## 2023-01-09 ENCOUNTER — Telehealth: Payer: Self-pay | Admitting: Family Medicine

## 2023-01-09 ENCOUNTER — Ambulatory Visit: Payer: Commercial Managed Care - PPO | Admitting: Family Medicine

## 2023-01-09 NOTE — Telephone Encounter (Signed)
Pt was a no show for an OV with Dr Doreene Burke on 01/09/23, I did send a letter.

## 2023-01-10 NOTE — Telephone Encounter (Signed)
Appt cancelled. Pt was TEXT NO.

## 2023-02-03 ENCOUNTER — Encounter: Payer: Self-pay | Admitting: *Deleted

## 2023-02-04 ENCOUNTER — Ambulatory Visit: Payer: Commercial Managed Care - PPO | Admitting: Adult Health

## 2023-02-04 ENCOUNTER — Telehealth: Payer: Self-pay | Admitting: Adult Health

## 2023-02-04 NOTE — Telephone Encounter (Signed)
Tammy with [employer] advised that they did receive HST on pt:  I just looked this patient up and it looks like the order came in on 12/25/2022, we texted her then. 12/26/2022 we texted again with a follow up call and left a message. 12/27/2022 we called and left a message again and another text. On 12/29/2022 she went online and started the registration process online, but stopped and didn't complete it.  I just called her myself and spoke with her, and she told me she did start the online registration and hadn't completed it yet but was planning to do so. I actually told her it would be best to call and finish up the registration since they can run a benefit check with her insurance. I gave her our support teams # to call and she said she was going to do that as soon as we hung up. I let her know I was going to pass this information along to you to keep you in the loop:). I hope this helps answer your question. Let me know if you need anything else.  In addition to this, Victorino Dike with [employer] has reached out a few times for the patient between yesterday & today.  The patient never called [employer] to complete the registration process.  I have left a VM for pt to call me.

## 2023-03-06 ENCOUNTER — Ambulatory Visit: Payer: Commercial Managed Care - PPO | Admitting: Family Medicine

## 2023-03-06 ENCOUNTER — Telehealth: Payer: Self-pay | Admitting: Family Medicine

## 2023-03-06 NOTE — Telephone Encounter (Signed)
Pt was a no show for an OV with Dr Doreene Burke on 03/06/23, no letter.

## 2023-03-07 NOTE — Telephone Encounter (Signed)
1st no show, letter sent via [facility]

## 2023-04-04 ENCOUNTER — Ambulatory Visit: Payer: Commercial Managed Care - PPO | Admitting: Family Medicine

## 2023-04-10 ENCOUNTER — Ambulatory Visit: Payer: Commercial Managed Care - PPO | Admitting: Family Medicine

## 2023-04-24 ENCOUNTER — Encounter: Payer: Self-pay | Admitting: Family Medicine

## 2023-04-24 ENCOUNTER — Telehealth: Payer: Self-pay | Admitting: Family Medicine

## 2023-04-24 ENCOUNTER — Ambulatory Visit: Payer: Commercial Managed Care - PPO | Admitting: Family Medicine

## 2023-04-24 NOTE — Telephone Encounter (Signed)
03/06/2023 no show 04/10/2023 late cancellation/I did not catch it 04/24/2023 late cancellation  Final warning sent via mail and mychart, pt has rescheduled for 05/01/2023

## 2023-05-01 ENCOUNTER — Ambulatory Visit (INDEPENDENT_AMBULATORY_CARE_PROVIDER_SITE_OTHER): Payer: Commercial Managed Care - PPO | Admitting: Family Medicine

## 2023-05-01 ENCOUNTER — Encounter: Payer: Self-pay | Admitting: Family Medicine

## 2023-05-01 VITALS — BP 126/82 | HR 86 | Temp 98.3°F | Ht 69.0 in | Wt 146.0 lb

## 2023-05-01 DIAGNOSIS — D509 Iron deficiency anemia, unspecified: Secondary | ICD-10-CM

## 2023-05-01 DIAGNOSIS — Z1211 Encounter for screening for malignant neoplasm of colon: Secondary | ICD-10-CM

## 2023-05-01 DIAGNOSIS — Z1159 Encounter for screening for other viral diseases: Secondary | ICD-10-CM | POA: Diagnosis not present

## 2023-05-01 LAB — CBC
HCT: 39.9 % (ref 36.0–46.0)
Hemoglobin: 13.2 g/dL (ref 12.0–15.0)
MCHC: 33.1 g/dL (ref 30.0–36.0)
MCV: 93.8 fL (ref 78.0–100.0)
Platelets: 318 10*3/uL (ref 150.0–400.0)
RBC: 4.26 Mil/uL (ref 3.87–5.11)
RDW: 13.1 % (ref 11.5–15.5)
WBC: 5.2 10*3/uL (ref 4.0–10.5)

## 2023-05-01 NOTE — Progress Notes (Signed)
 Established Patient Office Visit   Subjective:  Patient ID: Mikayla Baker, female    DOB: 06-08-1975  Age: 48 y.o. MRN: [id number]  Chief Complaint  Patient presents with   Medical Management of Chronic Issues    6 month follow up. Pt is fasting. No concerns.     HPI Encounter Diagnoses  Name Primary?   Microcytic anemia Yes   Encounter for hepatitis C screening test for low risk patient    Screening for colon cancer    For follow-up of above.  Has been taking her iron  tablets.  Status post IUD placement back in June.  This is made a huge difference in her heavy menstrual flow.  She had been using 7-8 pads daily for up to 2 weeks.  Continues to work as a engineer, civil (consulting) in the emergency room.   Review of Systems  Constitutional: Negative.   HENT: Negative.    Eyes:  Negative for blurred vision, discharge and redness.  Respiratory: Negative.    Cardiovascular: Negative.   Gastrointestinal:  Negative for abdominal pain, blood in stool and melena.  Genitourinary: Negative.  Negative for hematuria.  Musculoskeletal: Negative.  Negative for myalgias.  Skin:  Negative for rash.  Neurological:  Negative for tingling, loss of consciousness and weakness.  Endo/Heme/Allergies:  Negative for polydipsia.     Current Outpatient Medications:    Iron , Ferrous Sulfate , 325 (65 Fe) MG TABS, Take 325 mg by mouth daily., Disp: 90 tablet, Rfl: 3   Objective:     BP 126/82   Pulse 86   Temp 98.3 F (36.8 C)   Ht 5' 9 (1.753 m)   Wt 146 lb (66.2 kg)   SpO2 98%   BMI 21.56 kg/m  Wt Readings from Last 3 Encounters:  05/01/23 146 lb (66.2 kg)  12/24/22 140 lb (63.5 kg)  10/03/22 147 lb 3.2 oz (66.8 kg)      Physical Exam Constitutional:      General: She is not in acute distress.    Appearance: Normal appearance. She is not ill-appearing, toxic-appearing or diaphoretic.  HENT:     Head: Normocephalic and atraumatic.     Right Ear: External ear normal.     Left Ear: External ear  normal.  Eyes:     General: No scleral icterus.       Right eye: No discharge.        Left eye: No discharge.     Extraocular Movements: Extraocular movements intact.     Conjunctiva/sclera: Conjunctivae normal.  Cardiovascular:     Rate and Rhythm: Normal rate and regular rhythm.  Pulmonary:     Effort: Pulmonary effort is normal. No respiratory distress.     Breath sounds: No wheezing or rales.  Skin:    General: Skin is warm and dry.  Neurological:     Mental Status: She is alert and oriented to person, place, and time.  Psychiatric:        Mood and Affect: Mood normal.        Behavior: Behavior normal.      No results found for any visits on 05/01/23.    The 10-year ASCVD risk score (Arnett DK, et al., 2019) is: 0.8%    Assessment & Plan:   Microcytic anemia -     CBC -     Iron , TIBC and Ferritin Panel  Encounter for hepatitis C screening test for low risk patient -     Hepatitis C antibody  Screening  for colon cancer -     Ambulatory referral to Gastroenterology    Return in about 6 months (around 10/29/2023), or if symptoms worsen or fail to improve.    Elsie Sim Lent, MD

## 2023-05-02 LAB — HEPATITIS C ANTIBODY: Hepatitis C Ab: NONREACTIVE

## 2023-05-02 LAB — IRON,TIBC AND FERRITIN PANEL
%SAT: 12 % — ABNORMAL LOW (ref 16–45)
Ferritin: 47 ng/mL (ref 16–232)
Iron: 43 ug/dL (ref 40–190)
TIBC: 353 ug/dL (ref 250–450)

## 2024-02-16 ENCOUNTER — Other Ambulatory Visit: Payer: Self-pay | Admitting: Family Medicine

## 2024-02-16 DIAGNOSIS — Z1231 Encounter for screening mammogram for malignant neoplasm of breast: Secondary | ICD-10-CM

## 2024-03-08 ENCOUNTER — Ambulatory Visit
Admission: RE | Admit: 2024-03-08 | Discharge: 2024-03-08 | Disposition: A | Discharge status: Home / Self Care | Source: Ambulatory Visit

## 2024-03-08 DIAGNOSIS — Z1231 Encounter for screening mammogram for malignant neoplasm of breast: Secondary | ICD-10-CM | POA: Diagnosis not present

## 2024-03-11 ENCOUNTER — Other Ambulatory Visit: Payer: Self-pay | Admitting: Family Medicine

## 2024-03-11 DIAGNOSIS — R928 Other abnormal and inconclusive findings on diagnostic imaging of breast: Secondary | ICD-10-CM

## 2024-03-23 ENCOUNTER — Ambulatory Visit
Admission: RE | Admit: 2024-03-23 | Discharge: 2024-03-23 | Disposition: A | Discharge status: Home / Self Care | Source: Ambulatory Visit | Attending: Family Medicine | Admitting: Family Medicine

## 2024-03-23 DIAGNOSIS — R928 Other abnormal and inconclusive findings on diagnostic imaging of breast: Secondary | ICD-10-CM | POA: Diagnosis not present

## 2024-03-23 DIAGNOSIS — N6489 Other specified disorders of breast: Secondary | ICD-10-CM | POA: Diagnosis not present

## 2024-04-15 ENCOUNTER — Encounter: Admitting: Family Medicine

## 2024-04-30 ENCOUNTER — Encounter: Payer: Self-pay | Admitting: Family Medicine

## 2024-05-18 ENCOUNTER — Encounter: Admitting: Family Medicine

## 2024-05-20 ENCOUNTER — Ambulatory Visit: Payer: Self-pay | Admitting: Family Medicine

## 2024-05-20 ENCOUNTER — Encounter: Payer: Self-pay | Admitting: Family Medicine

## 2024-05-20 ENCOUNTER — Ambulatory Visit (INDEPENDENT_AMBULATORY_CARE_PROVIDER_SITE_OTHER): Admitting: Family Medicine

## 2024-05-20 VITALS — BP 122/80 | HR 98 | Temp 98.6°F | Ht 69.0 in | Wt 159.6 lb

## 2024-05-20 DIAGNOSIS — F5102 Adjustment insomnia: Secondary | ICD-10-CM

## 2024-05-20 DIAGNOSIS — R0681 Apnea, not elsewhere classified: Secondary | ICD-10-CM

## 2024-05-20 DIAGNOSIS — Z1211 Encounter for screening for malignant neoplasm of colon: Secondary | ICD-10-CM

## 2024-05-20 DIAGNOSIS — Z0000 Encounter for general adult medical examination without abnormal findings: Secondary | ICD-10-CM

## 2024-05-20 DIAGNOSIS — D509 Iron deficiency anemia, unspecified: Secondary | ICD-10-CM

## 2024-05-20 DIAGNOSIS — Z131 Encounter for screening for diabetes mellitus: Secondary | ICD-10-CM | POA: Diagnosis not present

## 2024-05-20 DIAGNOSIS — Z8639 Personal history of other endocrine, nutritional and metabolic disease: Secondary | ICD-10-CM

## 2024-05-20 DIAGNOSIS — Z13220 Encounter for screening for lipoid disorders: Secondary | ICD-10-CM | POA: Diagnosis not present

## 2024-05-20 LAB — TSH: TSH: 1.36 u[IU]/mL (ref 0.35–5.50)

## 2024-05-20 LAB — COMPREHENSIVE METABOLIC PANEL WITH GFR
ALT: 12 U/L (ref 3–35)
AST: 17 U/L (ref 5–37)
Albumin: 4 g/dL (ref 3.5–5.2)
Alkaline Phosphatase: 38 U/L — ABNORMAL LOW (ref 39–117)
BUN: 8 mg/dL (ref 6–23)
CO2: 26 meq/L (ref 19–32)
Calcium: 8.6 mg/dL (ref 8.4–10.5)
Chloride: 105 meq/L (ref 96–112)
Creatinine, Ser: 0.68 mg/dL (ref 0.40–1.20)
GFR: 104.16 mL/min
Glucose, Bld: 88 mg/dL (ref 70–99)
Potassium: 3.8 meq/L (ref 3.5–5.1)
Sodium: 136 meq/L (ref 135–145)
Total Bilirubin: 0.2 mg/dL (ref 0.2–1.2)
Total Protein: 7.4 g/dL (ref 6.0–8.3)

## 2024-05-20 LAB — CBC WITH DIFFERENTIAL/PLATELET
Basophils Absolute: 0 K/uL (ref 0.0–0.1)
Basophils Relative: 0.4 % (ref 0.0–3.0)
Eosinophils Absolute: 0.2 K/uL (ref 0.0–0.7)
Eosinophils Relative: 2.5 % (ref 0.0–5.0)
HCT: 37 % (ref 36.0–46.0)
Hemoglobin: 12.7 g/dL (ref 12.0–15.0)
Lymphocytes Relative: 36.5 % (ref 12.0–46.0)
Lymphs Abs: 2.7 K/uL (ref 0.7–4.0)
MCHC: 34.3 g/dL (ref 30.0–36.0)
MCV: 89.2 fl (ref 78.0–100.0)
Monocytes Absolute: 0.7 K/uL (ref 0.1–1.0)
Monocytes Relative: 9.4 % (ref 3.0–12.0)
Neutro Abs: 3.8 K/uL (ref 1.4–7.7)
Neutrophils Relative %: 51.2 % (ref 43.0–77.0)
Platelets: 301 K/uL (ref 150.0–400.0)
RBC: 4.15 Mil/uL (ref 3.87–5.11)
RDW: 12.5 % (ref 11.5–15.5)
WBC: 7.5 K/uL (ref 4.0–10.5)

## 2024-05-20 LAB — URINALYSIS, ROUTINE W REFLEX MICROSCOPIC
Bilirubin Urine: NEGATIVE
Ketones, ur: NEGATIVE
Leukocytes,Ua: NEGATIVE
Nitrite: NEGATIVE
Specific Gravity, Urine: 1.025 (ref 1.000–1.030)
Total Protein, Urine: NEGATIVE
Urine Glucose: NEGATIVE
Urobilinogen, UA: 0.2 (ref 0.0–1.0)
pH: 6 (ref 5.0–8.0)

## 2024-05-20 LAB — LIPID PANEL
Cholesterol: 161 mg/dL (ref 28–200)
HDL: 41.4 mg/dL
LDL Cholesterol: 106 mg/dL — ABNORMAL HIGH (ref 10–99)
NonHDL: 119.15
Total CHOL/HDL Ratio: 4
Triglycerides: 64 mg/dL (ref 10.0–149.0)
VLDL: 12.8 mg/dL (ref 0.0–40.0)

## 2024-05-20 LAB — HEMOGLOBIN A1C: Hgb A1c MFr Bld: 5.5 % (ref 4.6–6.5)

## 2024-05-20 MED ORDER — ESCITALOPRAM OXALATE 10 MG PO TABS: 10.0000 mg | ORAL_TABLET | Freq: Every day | ORAL | 0 refills | Status: AC

## 2024-05-20 NOTE — Progress Notes (Signed)
 "  Established Patient Office Visit   Subjective:  Patient ID: Mikayla Baker, female    DOB: 30-Aug-1975  Age: 49 y.o. MRN: [id number]  Chief Complaint  Patient presents with   Annual Exam    Patient also has complaint of anxiety     HPI Encounter Diagnoses  Name Primary?   Healthcare maintenance Yes   Screening for diabetes mellitus    Screening for cholesterol level    Microcytic anemia    Personal history of hyperthyroidism    Apnea    Adjustment insomnia    Screening for colon cancer    Here for physical and follow-up of above.  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  She lives at home with her husband.  There is no regular exercise.  She does have regular dental and GYN care.  Continues to snore and wake up through the night.  She is having difficulty falling asleep after her shift pends at 3 AM.  There is ongoing intermittent sadness that lingers sometimes over the loss of her child back in 2014.  It is worse around the Christmas time.  Never mated for the sleep study that was ordered last time.  She does continue with iron  therapy.  Menstrual flow is decreased significantly after placement of IUD.   Review of Systems  Constitutional: Negative.   HENT: Negative.    Eyes:  Negative for blurred vision, discharge and redness.  Respiratory: Negative.    Cardiovascular: Negative.   Gastrointestinal:  Negative for abdominal pain.  Genitourinary: Negative.   Musculoskeletal: Negative.  Negative for myalgias.  Skin:  Negative for rash.  Neurological:  Negative for tingling, loss of consciousness and weakness.  Endo/Heme/Allergies:  Negative for polydipsia.  Psychiatric/Behavioral:  Depression: dysthymia. The patient has insomnia.       05/20/2024    8:16 AM 10/03/2022    9:39 AM 10/03/2022    9:06 AM  Depression screen PHQ 2/9  Decreased Interest 0 1 0  Down, Depressed, Hopeless 1 1 1   PHQ - 2 Score 1 2 1   Altered sleeping 2 1   Tired, decreased energy 1 1    Change in appetite 0 0   Feeling bad or failure about yourself  0 0   Trouble concentrating 1 0   Moving slowly or fidgety/restless 1 0   Suicidal thoughts 0 0   PHQ-9 Score 6 4    Difficult doing work/chores Somewhat difficult Not difficult at all      Data saved with a previous flowsheet row definition     Current Medications[1]   Objective:     BP 122/80   Pulse 98   Temp 98.6 F (37 C)   Ht 5' 9 (1.753 m)   Wt 159 lb 9.6 oz (72.4 kg)   SpO2 98%   BMI 23.57 kg/m    Physical Exam Constitutional:      General: She is not in acute distress.    Appearance: Normal appearance. She is not ill-appearing, toxic-appearing or diaphoretic.  HENT:     Head: Normocephalic and atraumatic.     Right Ear: External ear normal.     Left Ear: External ear normal.     Ears:      Mouth/Throat:     Mouth: Mucous membranes are moist.     Pharynx: Oropharynx is clear. No oropharyngeal exudate or posterior oropharyngeal erythema.   Eyes:     General: No scleral icterus.  Right eye: No discharge.        Left eye: No discharge.     Extraocular Movements: Extraocular movements intact.     Conjunctiva/sclera: Conjunctivae normal.     Pupils: Pupils are equal, round, and reactive to light.  Cardiovascular:     Rate and Rhythm: Normal rate and regular rhythm.  Pulmonary:     Effort: Pulmonary effort is normal. No respiratory distress.     Breath sounds: Normal breath sounds.  Abdominal:     General: Bowel sounds are normal.  Musculoskeletal:     Cervical back: No rigidity or tenderness.  Skin:    General: Skin is warm and dry.  Neurological:     Mental Status: She is alert and oriented to person, place, and time.  Psychiatric:        Mood and Affect: Mood normal.        Behavior: Behavior normal.      No results found for any visits on 05/20/24.    The 10-year ASCVD risk score (Arnett DK, et al., 2019) is: 0.8%    Assessment & Plan:   Healthcare  maintenance -     Urinalysis, Routine w reflex microscopic  Screening for diabetes mellitus -     Comprehensive metabolic panel with GFR -     Hemoglobin A1c  Screening for cholesterol level -     Comprehensive metabolic panel with GFR -     Lipid panel  Microcytic anemia -     CBC with Differential/Platelet -     Iron , TIBC and Ferritin Panel  Personal history of hyperthyroidism -     TSH  Apnea -     Pulmonary Visit  Adjustment insomnia -     Escitalopram  Oxalate; Take 1 tablet (10 mg total) by mouth at bedtime.  Dispense: 90 tablet; Refill: 0  Screening for colon cancer -     Ambulatory referral to Gastroenterology    Return in about 3 months (around 08/18/2024), or if symptoms worsen or fail to improve.  Patient likely has significant apnea.  Urged her to please go for the consultation this time.  This could be the greatest part of her difficulty with sleep.  Also advised regular exercise such as walking for 30 minutes daily.  Will try low-dose Lexapro  10 mg.  She will adjust this to the time of day that works best for her.  Advised earwax removal kit.  Information also given on health maintenance and disease prevention.  Elsie Sim Lent, MD     [1]  Current Outpatient Medications:    escitalopram  (LEXAPRO ) 10 MG tablet, Take 1 tablet (10 mg total) by mouth at bedtime., Disp: 90 tablet, Rfl: 0   Iron , Ferrous Sulfate , 325 (65 Fe) MG TABS, Take 325 mg by mouth daily., Disp: 90 tablet, Rfl: 3 "

## 2024-05-21 LAB — IRON,TIBC AND FERRITIN PANEL
%SAT: 24 % (ref 16–45)
Ferritin: 123 ng/mL (ref 16–232)
Iron: 71 ug/dL (ref 40–190)
TIBC: 290 ug/dL (ref 250–450)

## 2024-05-21 NOTE — Telephone Encounter (Signed)
 My chart message has been sent to pt. From PCP

## 2024-06-10 ENCOUNTER — Ambulatory Visit: Admitting: Sleep Medicine

## 2024-06-11 ENCOUNTER — Encounter: Admitting: Family Medicine

## 2024-08-17 ENCOUNTER — Ambulatory Visit: Admitting: Family Medicine
